# Patient Record
Sex: Female | Born: 1975 | Hispanic: No | Marital: Married | Smoking: Current every day smoker
Health system: Southern US, Community
[De-identification: ages and names within clinical notes are randomized; demographics above are authoritative.]

## PROBLEM LIST (undated history)

## (undated) DIAGNOSIS — O24419 Gestational diabetes mellitus in pregnancy, unspecified control: Secondary | ICD-10-CM

## (undated) DIAGNOSIS — I10 Essential (primary) hypertension: Secondary | ICD-10-CM

## (undated) DIAGNOSIS — D649 Anemia, unspecified: Secondary | ICD-10-CM

## (undated) DIAGNOSIS — E079 Disorder of thyroid, unspecified: Secondary | ICD-10-CM

## (undated) DIAGNOSIS — E119 Type 2 diabetes mellitus without complications: Secondary | ICD-10-CM

## (undated) DIAGNOSIS — F329 Major depressive disorder, single episode, unspecified: Secondary | ICD-10-CM

## (undated) DIAGNOSIS — F419 Anxiety disorder, unspecified: Secondary | ICD-10-CM

## (undated) DIAGNOSIS — F32A Depression, unspecified: Secondary | ICD-10-CM

## (undated) HISTORY — DX: Disorder of thyroid, unspecified: E07.9

## (undated) HISTORY — DX: Gestational diabetes mellitus in pregnancy, unspecified control: O24.419

## (undated) HISTORY — DX: Major depressive disorder, single episode, unspecified: F32.9

## (undated) HISTORY — DX: Anxiety disorder, unspecified: F41.9

## (undated) HISTORY — DX: Type 2 diabetes mellitus without complications: E11.9

## (undated) HISTORY — PX: TUBAL LIGATION: SHX77

## (undated) HISTORY — DX: Anemia, unspecified: D64.9

## (undated) HISTORY — DX: Depression, unspecified: F32.A

---

## 2002-09-08 HISTORY — PX: CHOLECYSTECTOMY: SHX55

## 2002-09-26 ENCOUNTER — Encounter: Payer: Self-pay | Admitting: Family Medicine

## 2002-09-26 ENCOUNTER — Ambulatory Visit (HOSPITAL_COMMUNITY): Admission: RE | Admit: 2002-09-26 | Discharge: 2002-09-26 | Payer: Self-pay | Admitting: Family Medicine

## 2002-09-27 ENCOUNTER — Other Ambulatory Visit: Admission: RE | Admit: 2002-09-27 | Discharge: 2002-09-27 | Payer: Self-pay | Admitting: Family Medicine

## 2002-11-08 ENCOUNTER — Ambulatory Visit (HOSPITAL_COMMUNITY): Admission: RE | Admit: 2002-11-08 | Discharge: 2002-11-08 | Payer: Self-pay | Admitting: Family Medicine

## 2002-11-08 ENCOUNTER — Encounter: Payer: Self-pay | Admitting: Family Medicine

## 2003-02-27 ENCOUNTER — Encounter: Payer: Self-pay | Admitting: Family Medicine

## 2003-02-27 ENCOUNTER — Ambulatory Visit (HOSPITAL_COMMUNITY): Admission: RE | Admit: 2003-02-27 | Discharge: 2003-02-27 | Payer: Self-pay | Admitting: Family Medicine

## 2003-03-03 ENCOUNTER — Encounter: Admission: RE | Admit: 2003-03-03 | Discharge: 2003-03-03 | Payer: Self-pay | Admitting: Family Medicine

## 2003-03-03 ENCOUNTER — Inpatient Hospital Stay (HOSPITAL_COMMUNITY): Admission: AD | Admit: 2003-03-03 | Discharge: 2003-03-03 | Payer: Self-pay | Admitting: Family Medicine

## 2003-03-10 ENCOUNTER — Encounter: Admission: RE | Admit: 2003-03-10 | Discharge: 2003-03-10 | Payer: Self-pay | Admitting: *Deleted

## 2003-03-17 ENCOUNTER — Encounter: Admission: RE | Admit: 2003-03-17 | Discharge: 2003-03-17 | Payer: Self-pay | Admitting: Family Medicine

## 2003-03-18 ENCOUNTER — Inpatient Hospital Stay (HOSPITAL_COMMUNITY): Admission: AD | Admit: 2003-03-18 | Discharge: 2003-03-20 | Payer: Self-pay | Admitting: Family Medicine

## 2003-03-21 ENCOUNTER — Encounter: Admission: RE | Admit: 2003-03-21 | Discharge: 2003-04-20 | Payer: Self-pay | Admitting: Family Medicine

## 2003-05-19 ENCOUNTER — Encounter: Payer: Self-pay | Admitting: *Deleted

## 2003-05-19 ENCOUNTER — Inpatient Hospital Stay (HOSPITAL_COMMUNITY): Admission: EM | Admit: 2003-05-19 | Discharge: 2003-05-23 | Payer: Self-pay | Admitting: *Deleted

## 2003-05-20 ENCOUNTER — Encounter: Payer: Self-pay | Admitting: General Surgery

## 2003-05-22 ENCOUNTER — Encounter: Payer: Self-pay | Admitting: Internal Medicine

## 2005-12-25 ENCOUNTER — Ambulatory Visit (HOSPITAL_COMMUNITY): Admission: RE | Admit: 2005-12-25 | Discharge: 2005-12-25 | Payer: Self-pay | Admitting: Family Medicine

## 2010-10-08 ENCOUNTER — Emergency Department (HOSPITAL_COMMUNITY)
Admission: EM | Admit: 2010-10-08 | Discharge: 2010-10-08 | Payer: Self-pay | Source: Home / Self Care | Admitting: Emergency Medicine

## 2016-04-02 ENCOUNTER — Encounter: Payer: Self-pay | Admitting: Neurology

## 2016-05-07 ENCOUNTER — Ambulatory Visit (INDEPENDENT_AMBULATORY_CARE_PROVIDER_SITE_OTHER): Payer: BLUE CROSS/BLUE SHIELD | Admitting: Family Medicine

## 2016-05-07 ENCOUNTER — Encounter: Payer: Self-pay | Admitting: Family Medicine

## 2016-05-07 VITALS — BP 130/82 | HR 100 | Resp 18 | Ht 59.5 in | Wt 176.0 lb

## 2016-05-07 DIAGNOSIS — F4001 Agoraphobia with panic disorder: Secondary | ICD-10-CM | POA: Insufficient documentation

## 2016-05-07 DIAGNOSIS — O24419 Gestational diabetes mellitus in pregnancy, unspecified control: Secondary | ICD-10-CM

## 2016-05-07 DIAGNOSIS — Z7189 Other specified counseling: Secondary | ICD-10-CM

## 2016-05-07 DIAGNOSIS — E039 Hypothyroidism, unspecified: Secondary | ICD-10-CM | POA: Diagnosis not present

## 2016-05-07 DIAGNOSIS — E049 Nontoxic goiter, unspecified: Secondary | ICD-10-CM

## 2016-05-07 DIAGNOSIS — Z720 Tobacco use: Secondary | ICD-10-CM

## 2016-05-07 DIAGNOSIS — Z7689 Persons encountering health services in other specified circumstances: Secondary | ICD-10-CM

## 2016-05-07 DIAGNOSIS — O24410 Gestational diabetes mellitus in pregnancy, diet controlled: Secondary | ICD-10-CM | POA: Diagnosis not present

## 2016-05-07 DIAGNOSIS — E010 Iodine-deficiency related diffuse (endemic) goiter: Secondary | ICD-10-CM | POA: Insufficient documentation

## 2016-05-07 HISTORY — DX: Agoraphobia with panic disorder: F40.01

## 2016-05-07 HISTORY — DX: Gestational diabetes mellitus in pregnancy, unspecified control: O24.419

## 2016-05-07 MED ORDER — FLUOXETINE HCL 20 MG PO TABS: 20.0000 mg | ORAL_TABLET | Freq: Every day | ORAL | 3 refills | Status: DC

## 2016-05-07 NOTE — Progress Notes (Signed)
Chief Complaint  Patient presents with  . Establish Care    no previous pcp    40 year old Hispanic woman.  Born in [place], came to [place] a small child, "before kindergarten" here to establish care. Has not been to a doctor for about 9 years.   States was told she had problems with gestational diabetes and thyroid with her pregnancy, but never followed up.  Now worries about weight gain and hair thinning and thinks her thyroid is the problem.  She has longstanding anxiety that is untreated.  She says she cannot leave the house for social events, gets upset easily, cannot attend functions at the childrens school.  Wants treatment.  Discussed SSRI use and this is recommended.  Will prescribe fluoxetine because it has the least effect on weight.  Last Pap about 349 y.  Always nor,al.  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  Patient Active Problem List   Diagnosis Date Noted  . Gestational diabetes 05/07/2016  . Hypothyroidism 05/07/2016  . 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 Thyromegaly 05/07/2016    Outpatient Encounter Prescriptions as of 05/07/2016  Medication Sig  . FLUoxetine (PROZAC) 20 MG tablet Take 1 tablet (20 mg total) by mouth daily.   No facility-administered encounter medications on file as of 05/07/2016.    Family History  Problem Relation Age of Onset  . Hyperlipidemia Mother   . Hypertension Mother   . Diabetes Mother   . Heart disease Father [id number]    MI  . Hyperthyroidism Sister   . Diabetes Maternal Grandmother    Past Surgical History:  Procedure Laterality Date  . CHOLECYSTECTOMY  2004  . TUBAL LIGATION     Social History   Social History  . Marital status: Single    Spouse name: N/A  . Number of children: N/A  . Years of education: N/A   Occupational History  . Not on file.   Social History Main Topics  . Smoking status: Current Every Day Smoker    Packs/day: 0.10    Types: Cigarettes  . Smokeless tobacco:  Never Used     Comment: 2 cigarettes daily   . Alcohol use No  . Drug use: No  . Sexual activity: Yes    Birth control/ protection: Surgical   Other Topics Concern  . Not on file   Social History Narrative  . No narrative on file   Review of Systems  Constitutional: Negative for chills, fever and weight loss.       Gain weight - cannot lose  HENT: Negative for congestion and hearing loss.   Eyes: Negative for blurred vision and pain.  Respiratory: Negative for cough and shortness of breath.   Cardiovascular: Negative for chest pain, palpitations and leg swelling.  Gastrointestinal: Negative for abdominal pain, constipation, diarrhea and heartburn.  Genitourinary: Negative for dysuria and frequency.  Musculoskeletal: Negative for falls, joint pain and myalgias.  Neurological: Negative for dizziness, seizures and headaches.  Endo/Heme/Allergies: Negative for environmental allergies. Does not bruise/bleed easily.  Psychiatric/Behavioral: Negative for depression. The patient is nervous/anxious. The patient does not have insomnia.        Panic, anxiety leaving house   Physical Exam  Constitutional: She is oriented to person, place, and time. She appears well-developed and well-nourished. No distress.  HENT:  Head: Normocephalic and atraumatic.  Right Ear: External ear normal.  Left Ear: External ear normal.  Mouth/Throat: Oropharynx is clear and moist.  Eyes: Conjunctivae  are normal. Pupils are equal, round, and reactive to light.  Neck: Normal range of motion. Neck supple. Thyromegaly present.  Cardiovascular: Normal rate, regular rhythm and normal heart sounds.   Pulmonary/Chest: Effort normal and breath sounds normal.  Abdominal: Soft. Bowel sounds are normal. There is no tenderness.  Musculoskeletal: Normal range of motion. She exhibits no edema.  Lymphadenopathy:    She has no cervical adenopathy.  Neurological: She is alert and oriented to person, place, and time.  Dull  reflexes  Skin: Skin is warm and dry.  Psychiatric: She has a normal mood and affect. Her behavior is normal. Judgment and thought content normal.   1. Encounter to establish care with new doctor Lack of medical care many years  2. Diet-controlled gestational diabetes mellitus, unspecified trimester  - COMPLETE METABOLIC PANEL WITH GFR - HgB Z6X - CBC  3. Hypothyroidism, unspecified hypothyroidism type  - TSH  4. Agoraphobia with panic disorder Start fluoxetine.  Risks and benefits discussed.  5. Thyromegaly   6. Tobacco abuse Advised to quit- smokes 2 cig a day.  Patient Instructions  Take the fluoxetine every day This is for anxiety Call if you have any proplems or side effects See me in a month  Due for a PAP and PE  Labs ordered I will notify you of your results

## 2016-05-07 NOTE — Patient Instructions (Signed)
Take the fluoxetine every day This is for anxiety Call if you have any proplems or side effects See me in a month  Due for a PAP and PE  Labs ordered I will notify you of your results

## 2016-05-08 LAB — COMPLETE METABOLIC PANEL WITH GFR
ALBUMIN: 3.8 g/dL (ref 3.6–5.1)
ALK PHOS: 93 U/L (ref 33–115)
ALT: 21 U/L (ref 6–29)
AST: 37 U/L — AB (ref 10–30)
BUN: 10 mg/dL (ref 7–25)
CALCIUM: 8.8 mg/dL (ref 8.6–10.2)
CHLORIDE: 103 mmol/L (ref 98–110)
CO2: 26 mmol/L (ref 20–31)
Creat: 0.53 mg/dL (ref 0.50–1.10)
GFR, Est African American: >89 mL/min (ref >=60)
GFR, Est Non African American: >89 mL/min (ref >=60)
GLUCOSE: 90 mg/dL (ref 65–99)
Potassium: 4 mmol/L (ref 3.5–5.3)
Sodium: 139 mmol/L (ref 135–146)
TOTAL PROTEIN: 7.3 g/dL (ref 6.1–8.1)
Total Bilirubin: 0.6 mg/dL (ref 0.2–1.2)

## 2016-05-08 LAB — CBC
HCT: 27.5 % — ABNORMAL LOW (ref 35.0–45.0)
HEMOGLOBIN: 8.1 g/dL — AB (ref 11.7–15.5)
MCH: 18.9 pg — ABNORMAL LOW (ref 27.0–33.0)
MCHC: 29.5 g/dL — AB (ref 32.0–36.0)
MCV: 64.1 fL — ABNORMAL LOW (ref 80.0–100.0)
MPV: 9 fL (ref 7.5–12.5)
PLATELETS: 333 10*3/uL (ref 140–400)
RBC: 4.29 MIL/uL (ref 3.80–5.10)
RDW: 19.4 % — ABNORMAL HIGH (ref 11.0–15.0)
WBC: 7.8 10*3/uL (ref 3.8–10.8)

## 2016-05-08 LAB — TSH: TSH: 8.89 mIU/L — ABNORMAL HIGH

## 2016-05-09 ENCOUNTER — Encounter: Payer: Self-pay | Admitting: Family Medicine

## 2016-05-09 LAB — HEMOGLOBIN A1C
HEMOGLOBIN A1C: 5.2 % (ref <5.7)
MEAN PLASMA GLUCOSE: 103 mg/dL

## 2016-05-20 ENCOUNTER — Telehealth: Payer: Self-pay | Admitting: Family Medicine

## 2016-05-21 NOTE — Telephone Encounter (Signed)
Opened In Error

## 2016-05-22 ENCOUNTER — Ambulatory Visit (INDEPENDENT_AMBULATORY_CARE_PROVIDER_SITE_OTHER): Payer: BLUE CROSS/BLUE SHIELD | Admitting: Family Medicine

## 2016-05-22 ENCOUNTER — Encounter: Payer: Self-pay | Admitting: Family Medicine

## 2016-05-22 VITALS — BP 126/80 | HR 76 | Resp 18 | Ht 59.5 in | Wt 173.0 lb

## 2016-05-22 DIAGNOSIS — E039 Hypothyroidism, unspecified: Secondary | ICD-10-CM | POA: Diagnosis not present

## 2016-05-22 DIAGNOSIS — D509 Iron deficiency anemia, unspecified: Secondary | ICD-10-CM | POA: Diagnosis not present

## 2016-05-22 DIAGNOSIS — F4001 Agoraphobia with panic disorder: Secondary | ICD-10-CM

## 2016-05-22 DIAGNOSIS — D649 Anemia, unspecified: Secondary | ICD-10-CM | POA: Insufficient documentation

## 2016-05-22 LAB — CBC WITH DIFFERENTIAL/PLATELET
Basophils Absolute: 0 cells/uL (ref 0–200)
Basophils Relative: 0 %
EOS ABS: 144 {cells}/uL (ref 15–500)
Eosinophils Relative: 2 %
HEMATOCRIT: 27.3 % — AB (ref 35.0–45.0)
HEMOGLOBIN: 8 g/dL — AB (ref 11.7–15.5)
LYMPHS ABS: 2016 {cells}/uL (ref 850–3900)
LYMPHS PCT: 28 %
MCH: 18.8 pg — ABNORMAL LOW (ref 27.0–33.0)
MCHC: 29.3 g/dL — AB (ref 32.0–36.0)
MCV: 64.2 fL — AB (ref 80.0–100.0)
MONO ABS: 432 {cells}/uL (ref 200–950)
MPV: 8.6 fL (ref 7.5–12.5)
Monocytes Relative: 6 %
NEUTROS PCT: 64 %
Neutro Abs: 4608 cells/uL (ref 1500–7800)
Platelets: 410 10*3/uL — ABNORMAL HIGH (ref 140–400)
RBC: 4.25 MIL/uL (ref 3.80–5.10)
RDW: 19.3 % — AB (ref 11.0–15.0)
WBC: 7.2 10*3/uL (ref 3.8–10.8)

## 2016-05-22 LAB — ANEMIA PROFILE B
%SAT: 3 % — AB (ref 11–50)
FOLATE: 23.5 ng/mL (ref >5.4)
Ferritin: 5 ng/mL — ABNORMAL LOW (ref 10–154)
Iron: 12 ug/dL — ABNORMAL LOW (ref 40–190)
TIBC: 416 ug/dL (ref 250–450)
VITAMIN B 12: 402 pg/mL (ref 200–1100)

## 2016-05-22 LAB — RETICULOCYTES
ABS RETIC: 114750 {cells}/uL — AB (ref 20000–80000)
RBC.: 4.25 MIL/uL (ref 3.80–5.10)
Retic Ct Pct: 2.7 %

## 2016-05-22 MED ORDER — LEVOTHYROXINE SODIUM 75 MCG PO TABS: 75.0000 ug | ORAL_TABLET | Freq: Every day | ORAL | 3 refills | Status: DC

## 2016-05-22 NOTE — Addendum Note (Signed)
Addended by: Kandis FantasiaSLADE, COURTNEY B on: 05/22/2016 10:24 AM   Modules accepted: Orders

## 2016-05-22 NOTE — Progress Notes (Signed)
Chief Complaint  Patient presents with  . Results    discuss lab results  . Medication Management    would like to discuss fluoxetine    Here for follow up Started on fluoxetine last visit.  Cannot tell it is helping yet.  No side effects.  We discussed that this medicine takes 4-6 weeks to take full effect and that it would be best for her to give it some time. If it is not started to help her by the next visit, then changing to a different SSRI may be advisable. Labwork is discussed with patient. She is hypothyroid. Her TSH is only 8.8, but given her fatigue and symptoms of its best to treat this. I do worry that thyroid hormone may increase her anxiety and feeling of tremulousness. I am going to start her on a low dose and follow. She was found on blood work to be significantly anemic. She does recall being anemic before. She does have heavy menstrual periods she does not take any vitamin or iron supplements. She is on a well-balanced diet. I told her this may contribute to her feeling of fatigue. Additional lab work is indicated to determine/confirm that it is an iron deficiency anemia. She denies any unusual blood loss, blood in her bowels, or gastrointestinal symptoms. We discussed that she does have a treadmill in her home. I recommend that she start walking on this daily. She should start slow and then workup. Her goal is 150 minutes a week. She still smokes cigarettes. She only smokes 2 cigarettes a day and doesn't see that this is a problem for her. I encouraged her to try to quit.   Patient Active Problem List   Diagnosis Date Noted  . Anemia 05/22/2016  . Hypothyroidism 05/07/2016  . 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 Thyromegaly 05/07/2016    Outpatient Encounter Prescriptions as of 05/22/2016  Medication Sig  . FLUoxetine (PROZAC) 20 MG tablet Take 1 tablet (20 mg total) by mouth daily.  Marland Kitchen levothyroxine (SYNTHROID, LEVOTHROID) 75 MCG tablet Take 1 tablet (75  mcg total) by mouth daily.   No facility-administered encounter medications on file as of 05/22/2016.     No Known Allergies  Review of Systems  Constitutional: Positive for fatigue. Negative for activity change and appetite change.  HENT: Negative.  Negative for congestion and dental problem.   Eyes: Negative.  Negative for visual disturbance.  Respiratory: Negative.  Negative for cough and shortness of breath.   Cardiovascular: Negative.  Negative for chest pain, palpitations and leg swelling.  Gastrointestinal: Negative.  Negative for blood in stool, constipation and diarrhea.  Genitourinary: Negative for difficulty urinating, hematuria and menstrual problem.  Neurological: Positive for tremors.       Patient is worried about developing a tremor like her mother. She does have tremulous hands from her anxiety.  Psychiatric/Behavioral: Negative for dysphoric mood and sleep disturbance. The patient is nervous/anxious.    Results for ZEMIRA, ZEHRING (MRN [id number]) as of 05/22/2016 10:14  Ref. Range 05/07/2016 08:22  Sodium Latest Ref Range: 135 - 146 mmol/L 139  Potassium Latest Ref Range: 3.5 - 5.3 mmol/L 4.0  Chloride Latest Ref Range: 98 - 110 mmol/L 103  CO2 Latest Ref Range: 20 - 31 mmol/L 26  Mean Plasma Glucose Latest Units: mg/dL 413  BUN Latest Ref Range: 7 - 25 mg/dL 10  Creatinine Latest Ref Range: 0.50 - 1.10 mg/dL 2.44  Calcium Latest Ref Range: 8.6 - 10.2 mg/dL 8.8  Glucose Latest Ref Range: 65 - 99 mg/dL 90  Alkaline Phosphatase Latest Ref Range: 33 - 115 U/L 93  Albumin Latest Ref Range: 3.6 - 5.1 g/dL 3.8  AST Latest Ref Range: 10 - 30 U/L 37 (H)  ALT Latest Ref Range: 6 - 29 U/L 21  Total Protein Latest Ref Range: 6.1 - 8.1 g/dL 7.3  Total Bilirubin Latest Ref Range: 0.2 - 1.2 mg/dL 0.6  GFR, Est African American Latest Ref Range: >=60 mL/min >89  GFR, Est Non African American Latest Ref Range: >=60 mL/min >89  WBC Latest Ref Range: 3.8 - 10.8 K/uL 7.8  RBC Latest  Ref Range: 3.80 - 5.10 MIL/uL 4.29  Hemoglobin Latest Ref Range: 11.7 - 15.5 g/dL 8.1 (L)  HCT Latest Ref Range: 35.0 - 45.0 % 27.5 (L)  MCV Latest Ref Range: 80.0 - 100.0 fL 64.1 (L)  MCH Latest Ref Range: 27.0 - 33.0 pg 18.9 (L)  MCHC Latest Ref Range: 32.0 - 36.0 g/dL 40.929.5 (L)  RDW Latest Ref Range: 11.0 - 15.0 % 19.4 (H)  Platelets Latest Ref Range: 140 - 400 K/uL 333  MPV Latest Ref Range: 7.5 - 12.5 fL 9.0  Hemoglobin A1C Latest Ref Range: <5.7 % 5.2  TSH Latest Units: mIU/L 8.89 (H)    BP 126/80   Pulse 76   Resp 18   Ht 4' 11.5" (1.511 m)   Wt 173 lb (78.5 kg)   LMP 04/23/2016 (Within Days)   SpO2 98%   BMI 34.36 kg/m   Physical Exam  Constitutional: She is oriented to person, place, and time. She appears well-developed and well-nourished. No distress.  HENT:  Head: Normocephalic and atraumatic.  Mouth/Throat: Oropharynx is clear and moist.  Eyes: Conjunctivae are normal. Pupils are equal, round, and reactive to light.  Neck: Normal range of motion. Neck supple. Thyromegaly present.  Cardiovascular: Normal rate, regular rhythm and normal heart sounds.   Pulmonary/Chest: Effort normal and breath sounds normal.  Abdominal: Soft. Bowel sounds are normal. There is no tenderness.  Musculoskeletal: Normal range of motion. She exhibits no edema.  Lymphadenopathy:    She has no cervical adenopathy.  Neurological: She is alert and oriented to person, place, and time.  Dull reflexes  Skin: Skin is warm and dry.  Psychiatric: She has a normal mood and affect. Her behavior is normal. Judgment and thought content normal.   ASSESSMENT/PLAN:  1. Iron deficiency anemia  - CBC with Differential - IBC panel - Retic  2. Hypothyroidism, unspecified hypothyroidism type   3. Agoraphobia with panic disorder    Patient Instructions  The fluoxetine takes a while to work, give it 4-6 weeks to take full effect If it does not seem to help, I will change medicine  Take the  synthroid once a day on an empty stomach  Need more blood work to check on the anemia  Start an iron supplement  See me in one month for a PE and flu shot   Eustace MooreYvonne Sue Donn Wilmot, MD

## 2016-05-22 NOTE — Patient Instructions (Signed)
The fluoxetine takes a while to work, give it 4-6 weeks to take full effect If it does not seem to help, I will change medicine  Take the synthroid once a day on an empty stomach  Need more blood work to check on the anemia  Start an iron supplement  See me in one month for a PE and flu shot

## 2016-05-27 ENCOUNTER — Encounter: Payer: Self-pay | Admitting: Family Medicine

## 2016-06-09 ENCOUNTER — Encounter: Payer: BLUE CROSS/BLUE SHIELD | Admitting: Family Medicine

## 2016-06-23 ENCOUNTER — Encounter: Payer: BLUE CROSS/BLUE SHIELD | Admitting: Family Medicine

## 2016-07-01 ENCOUNTER — Encounter: Payer: BLUE CROSS/BLUE SHIELD | Admitting: Family Medicine

## 2017-11-16 ENCOUNTER — Encounter: Payer: Self-pay | Admitting: Family Medicine

## 2018-02-05 DIAGNOSIS — Z6837 Body mass index (BMI) 37.0-37.9, adult: Secondary | ICD-10-CM | POA: Diagnosis not present

## 2018-02-05 DIAGNOSIS — E038 Other specified hypothyroidism: Secondary | ICD-10-CM | POA: Diagnosis not present

## 2018-02-05 DIAGNOSIS — I10 Essential (primary) hypertension: Secondary | ICD-10-CM | POA: Diagnosis not present

## 2018-02-12 DIAGNOSIS — I10 Essential (primary) hypertension: Secondary | ICD-10-CM | POA: Diagnosis not present

## 2018-02-12 DIAGNOSIS — E038 Other specified hypothyroidism: Secondary | ICD-10-CM | POA: Diagnosis not present

## 2018-02-12 DIAGNOSIS — Z6837 Body mass index (BMI) 37.0-37.9, adult: Secondary | ICD-10-CM | POA: Diagnosis not present

## 2018-05-06 DIAGNOSIS — Z6836 Body mass index (BMI) 36.0-36.9, adult: Secondary | ICD-10-CM | POA: Diagnosis not present

## 2018-05-06 DIAGNOSIS — I10 Essential (primary) hypertension: Secondary | ICD-10-CM | POA: Diagnosis not present

## 2018-05-06 DIAGNOSIS — Z6837 Body mass index (BMI) 37.0-37.9, adult: Secondary | ICD-10-CM | POA: Diagnosis not present

## 2018-05-06 DIAGNOSIS — E038 Other specified hypothyroidism: Secondary | ICD-10-CM | POA: Diagnosis not present

## 2018-05-06 DIAGNOSIS — Z0000 Encounter for general adult medical examination without abnormal findings: Secondary | ICD-10-CM | POA: Diagnosis not present

## 2018-08-04 DIAGNOSIS — Z6837 Body mass index (BMI) 37.0-37.9, adult: Secondary | ICD-10-CM | POA: Diagnosis not present

## 2018-08-04 DIAGNOSIS — E038 Other specified hypothyroidism: Secondary | ICD-10-CM | POA: Diagnosis not present

## 2018-08-04 DIAGNOSIS — I10 Essential (primary) hypertension: Secondary | ICD-10-CM | POA: Diagnosis not present

## 2018-11-04 DIAGNOSIS — I10 Essential (primary) hypertension: Secondary | ICD-10-CM | POA: Diagnosis not present

## 2018-11-04 DIAGNOSIS — Z6837 Body mass index (BMI) 37.0-37.9, adult: Secondary | ICD-10-CM | POA: Diagnosis not present

## 2018-11-04 DIAGNOSIS — F064 Anxiety disorder due to known physiological condition: Secondary | ICD-10-CM | POA: Diagnosis not present

## 2018-11-04 DIAGNOSIS — E038 Other specified hypothyroidism: Secondary | ICD-10-CM | POA: Diagnosis not present

## 2018-11-05 DIAGNOSIS — I10 Essential (primary) hypertension: Secondary | ICD-10-CM | POA: Diagnosis not present

## 2018-11-05 DIAGNOSIS — E038 Other specified hypothyroidism: Secondary | ICD-10-CM | POA: Diagnosis not present

## 2018-11-05 DIAGNOSIS — Z6837 Body mass index (BMI) 37.0-37.9, adult: Secondary | ICD-10-CM | POA: Diagnosis not present

## 2018-11-05 DIAGNOSIS — F064 Anxiety disorder due to known physiological condition: Secondary | ICD-10-CM | POA: Diagnosis not present

## 2018-11-11 DIAGNOSIS — R079 Chest pain, unspecified: Secondary | ICD-10-CM | POA: Diagnosis not present

## 2019-02-03 DIAGNOSIS — M722 Plantar fascial fibromatosis: Secondary | ICD-10-CM | POA: Diagnosis not present

## 2019-02-03 DIAGNOSIS — I10 Essential (primary) hypertension: Secondary | ICD-10-CM | POA: Diagnosis not present

## 2019-02-03 DIAGNOSIS — E038 Other specified hypothyroidism: Secondary | ICD-10-CM | POA: Diagnosis not present

## 2019-02-03 DIAGNOSIS — F064 Anxiety disorder due to known physiological condition: Secondary | ICD-10-CM | POA: Diagnosis not present

## 2019-05-05 DIAGNOSIS — F064 Anxiety disorder due to known physiological condition: Secondary | ICD-10-CM | POA: Diagnosis not present

## 2019-05-05 DIAGNOSIS — E038 Other specified hypothyroidism: Secondary | ICD-10-CM | POA: Diagnosis not present

## 2019-05-05 DIAGNOSIS — I10 Essential (primary) hypertension: Secondary | ICD-10-CM | POA: Diagnosis not present

## 2020-10-21 ENCOUNTER — Emergency Department (HOSPITAL_COMMUNITY)
Admission: EM | Admit: 2020-10-21 | Discharge: 2020-10-21 | Disposition: A | Discharge status: Home / Self Care | Payer: 59 | Attending: Emergency Medicine | Admitting: Emergency Medicine

## 2020-10-21 ENCOUNTER — Emergency Department (HOSPITAL_COMMUNITY): Payer: 59

## 2020-10-21 ENCOUNTER — Other Ambulatory Visit: Payer: Self-pay

## 2020-10-21 ENCOUNTER — Encounter (HOSPITAL_COMMUNITY): Payer: Self-pay | Admitting: Emergency Medicine

## 2020-10-21 DIAGNOSIS — E119 Type 2 diabetes mellitus without complications: Secondary | ICD-10-CM | POA: Insufficient documentation

## 2020-10-21 DIAGNOSIS — Z79899 Other long term (current) drug therapy: Secondary | ICD-10-CM | POA: Diagnosis not present

## 2020-10-21 DIAGNOSIS — E039 Hypothyroidism, unspecified: Secondary | ICD-10-CM | POA: Insufficient documentation

## 2020-10-21 DIAGNOSIS — R519 Headache, unspecified: Secondary | ICD-10-CM | POA: Insufficient documentation

## 2020-10-21 DIAGNOSIS — R079 Chest pain, unspecified: Secondary | ICD-10-CM | POA: Insufficient documentation

## 2020-10-21 DIAGNOSIS — R110 Nausea: Secondary | ICD-10-CM | POA: Diagnosis not present

## 2020-10-21 DIAGNOSIS — I10 Essential (primary) hypertension: Secondary | ICD-10-CM | POA: Insufficient documentation

## 2020-10-21 DIAGNOSIS — F17210 Nicotine dependence, cigarettes, uncomplicated: Secondary | ICD-10-CM | POA: Diagnosis not present

## 2020-10-21 HISTORY — DX: Essential (primary) hypertension: I10

## 2020-10-21 LAB — CBC
HCT: 36.5 % (ref 36.0–46.0)
Hemoglobin: 12 g/dL (ref 12.0–15.0)
MCH: 28.6 pg (ref 26.0–34.0)
MCHC: 32.9 g/dL (ref 30.0–36.0)
MCV: 87.1 fL (ref 80.0–100.0)
Platelets: 281 10*3/uL (ref 150–400)
RBC: 4.19 MIL/uL (ref 3.87–5.11)
RDW: 13.9 % (ref 11.5–15.5)
WBC: 10.9 10*3/uL — ABNORMAL HIGH (ref 4.0–10.5)
nRBC: 0 % (ref 0.0–0.2)

## 2020-10-21 LAB — BASIC METABOLIC PANEL
Anion gap: 7 (ref 5–15)
BUN: 15 mg/dL (ref 6–20)
CO2: 24 mmol/L (ref 22–32)
Calcium: 9 mg/dL (ref 8.9–10.3)
Chloride: 105 mmol/L (ref 98–111)
Creatinine, Ser: 0.54 mg/dL (ref 0.44–1.00)
GFR, Estimated: >60 mL/min (ref >60)
Glucose, Bld: 155 mg/dL — ABNORMAL HIGH (ref 70–99)
Potassium: 3.4 mmol/L — ABNORMAL LOW (ref 3.5–5.1)
Sodium: 136 mmol/L (ref 135–145)

## 2020-10-21 LAB — SEDIMENTATION RATE: Sed Rate: 40 mm/hr — ABNORMAL HIGH (ref 0–22)

## 2020-10-21 MED ORDER — KETOROLAC TROMETHAMINE 30 MG/ML IJ SOLN
15.0000 mg | Freq: Once | INTRAMUSCULAR | Status: AC
  Administered 2020-10-21: 15 mg via INTRAVENOUS
  Filled 2020-10-21: qty 1

## 2020-10-21 MED ORDER — PROMETHAZINE HCL 25 MG PO TABS: 12.5000 mg | ORAL_TABLET | Freq: Three times a day (TID) | ORAL | 0 refills | Status: DC | PRN

## 2020-10-21 MED ORDER — PROMETHAZINE HCL 25 MG/ML IJ SOLN
12.5000 mg | Freq: Once | INTRAMUSCULAR | Status: AC
  Administered 2020-10-21: 12.5 mg via INTRAVENOUS
  Filled 2020-10-21: qty 1

## 2020-10-21 MED ORDER — SODIUM CHLORIDE 0.9 % IV BOLUS
500.0000 mL | Freq: Once | INTRAVENOUS | Status: AC
  Administered 2020-10-21: 500 mL via INTRAVENOUS

## 2020-10-21 NOTE — ED Triage Notes (Signed)
Pt to the ED with c/o headaches x 1 month. Pt has been seen by her PCP who has been treating her for migraines.  She states she does not believe she has migraines as the medication that was prescribed is not working.

## 2020-10-21 NOTE — ED Provider Notes (Signed)
[facility] EMERGENCY DEPARTMENT Provider Note   CSN: [id number] Arrival date & time: 10/21/20  2049     History Chief Complaint  Patient presents with  . Headache    Meagan Reynolds is a 45 y.o. female.  HPI Patient presents with headache. Throbbing right-sided. Has had for about a month. Is been up pretty constantly although wax and wane somewhat. Saw her PCP and started on medicine for migraines. States it did not work. Has had some mild nausea. No photophobia. No fevers or chills. No injury. States she cannot sleep on that side because of pain with palpation. At time also states she has chest pain. Sharp. Comes and goes. Does not feel short of breath. No swelling in legs. No abdominal pain. Denies possibility of pregnancy. No history of headaches like this before a month ago. States that she would have occasional headaches but not really this type and it would always go away with Tylenol.    Past Medical History:  Diagnosis Date  . Anemia   . Anxiety   . Depression   . Diabetes mellitus without complication (HCC)    gestational  . Gestational diabetes 05/07/2016  . Hypertension   . Thyroid disease     Patient Active Problem List   Diagnosis Date Noted  . Anemia 05/22/2016  . Hypothyroidism 05/07/2016  . 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 Thyromegaly 05/07/2016    Past Surgical History:  Procedure Laterality Date  . CHOLECYSTECTOMY  2004  . TUBAL LIGATION       OB History   No obstetric history on file.     Family History  Problem Relation Age of Onset  . Hyperlipidemia Mother   . Hypertension Mother   . Diabetes Mother   . Heart disease Father 4       MI  . Hyperthyroidism Sister   . Diabetes Maternal Grandmother     Social History   Tobacco Use  . Smoking status: Current Every Day Smoker    Packs/day: 0.10    Types: Cigarettes  . Smokeless tobacco: Never Used  . Tobacco comment: 2 cigarettes daily   Vaping Use  . Vaping Use: Never  used  Substance Use Topics  . Alcohol use: No  . Drug use: No    Home Medications Prior to Admission medications   Medication Sig Start Date End Date Taking? Authorizing Provider  promethazine (PHENERGAN) 25 MG tablet Take 0.5 tablets (12.5 mg total) by mouth every 8 (eight) hours as needed for nausea or vomiting. 10/21/20  Yes Benjiman Core, MD  FLUoxetine (PROZAC) 20 MG tablet Take 1 tablet (20 mg total) by mouth daily. 05/07/16   Eustace Moore, MD  levothyroxine (SYNTHROID, LEVOTHROID) 75 MCG tablet Take 1 tablet (75 mcg total) by mouth daily. 05/22/16   Eustace Moore, MD    Allergies    Patient has no known allergies.  Review of Systems   Review of Systems  Constitutional: Negative for appetite change and fever.  HENT: Negative for congestion.   Eyes: Negative for photophobia and visual disturbance.  Respiratory: Negative for shortness of breath.   Cardiovascular: Positive for chest pain.  Gastrointestinal: Positive for nausea. Negative for abdominal pain.  Genitourinary: Negative for flank pain.  Musculoskeletal: Negative for back pain.  Skin: Negative for rash.  Neurological: Positive for headaches. Negative for weakness.  Psychiatric/Behavioral: Negative for confusion.    Physical Exam Updated Vital Signs BP (!) 150/82   Pulse 88   Temp 98.3  F (36.8 C) (Oral)   Resp 18   Ht 4\' 11"  (1.499 m)   Wt 85.3 kg   LMP 09/24/2020 (Approximate)   SpO2 100%   BMI 37.97 kg/m   Physical Exam Vitals and nursing note reviewed.  Constitutional:      Appearance: She is well-developed.  HENT:     Head: Atraumatic.     Comments: Tenderness to right lateral head. No skin changes. Eyes:     Extraocular Movements: Extraocular movements intact.     Pupils: Pupils are equal, round, and reactive to light.  Cardiovascular:     Rate and Rhythm: Regular rhythm.  Chest:     Chest wall: No tenderness.  Abdominal:     Tenderness: There is no abdominal tenderness.   Musculoskeletal:     Cervical back: Neck supple.  Skin:    General: Skin is warm.  Neurological:     Mental Status: She is alert and oriented to person, place, and time. Mental status is at baseline.  Psychiatric:        Mood and Affect: Mood normal.     ED Results / Procedures / Treatments   Labs (all labs ordered are listed, but only abnormal results are displayed) Labs Reviewed  BASIC METABOLIC PANEL - Abnormal; Notable for the following components:      Result Value   Potassium 3.4 (*)    Glucose, Bld 155 (*)    All other components within normal limits  CBC - Abnormal; Notable for the following components:   WBC 10.9 (*)    All other components within normal limits  SEDIMENTATION RATE    EKG None  Radiology DG Chest 2 View  Result Date: 10/21/2020 CLINICAL DATA:  Chest pain. EXAM: CHEST - 2 VIEW COMPARISON:  None. FINDINGS: The cardiomediastinal contours are normal. The lungs are clear. Pulmonary vasculature is normal. No consolidation, pleural effusion, or pneumothorax. No acute osseous abnormalities are seen. IMPRESSION: Normal chest radiographs. Electronically Signed   By: 10/23/2020 M.D.   On: 10/21/2020 22:14   CT Head Wo Contrast  Result Date: 10/21/2020 CLINICAL DATA:  Right-sided headaches for 1 month. EXAM: CT HEAD WITHOUT CONTRAST TECHNIQUE: Contiguous axial images were obtained from the base of the skull through the vertex without intravenous contrast. COMPARISON:  None. FINDINGS: Brain: No evidence of acute infarction, hemorrhage, hydrocephalus, extra-axial collection or mass lesion/mass effect. Vascular: No hyperdense vessel or unexpected calcification. Skull: Normal. Negative for fracture or focal lesion. Sinuses/Orbits: Visualized globes and orbits are unremarkable. Visualized sinuses are clear. Other: None. IMPRESSION: Normal enhanced CT scan of the brain. Electronically Signed   By: 10/23/2020 M.D.   On: 10/21/2020 22:01     Procedures Procedures   Medications Ordered in ED Medications  sodium chloride 0.9 % bolus 500 mL (500 mLs Intravenous New Bag/Given 10/21/20 2128)  promethazine (PHENERGAN) injection 12.5 mg (12.5 mg Intravenous Given 10/21/20 2133)  ketorolac (TORADOL) 30 MG/ML injection 15 mg (15 mg Intravenous Given 10/21/20 2128)    ED Course  I have reviewed the triage vital signs and the nursing notes.  Pertinent labs & imaging results that were available during my care of the patient were reviewed by me and considered in my medical decision making (see chart for details).    MDM Rules/Calculators/A&P                         Patient with headache.  Has had over the last month.  Constant.  Dull.  Throbbing.  Nonfocal neuro exam.  Some mild nausea.  No photophobia.  No vision changes.  Has seen PCP and started on unknown migraine medicine reportedly did not help.  Head CT done and reassuring.  Chest x-ray done due to sharp chest pain at times.  Lab work reassuring although sed rate still pending.  She is rather young for giant cell arteritis.  Sed rate done and pending but do not think we need to wait the extra time in the ER for it.  Can followed up with neurology or PCP.  Feeling better after treatment.  Discharge home.  We will give some oral Phenergan to take in case it flares back up although it may not help Final Clinical Impression(s) / ED Diagnoses Final diagnoses:  Nonintractable headache, unspecified chronicity pattern, unspecified headache type    Rx / DC Orders ED Discharge Orders         Ordered    promethazine (PHENERGAN) 25 MG tablet  Every 8 hours PRN        10/21/20 2231           Benjiman Core, MD 10/21/20 2235

## 2020-10-21 NOTE — Discharge Instructions (Signed)
Follow-up with neurology.  You can check the results of the sed rate on MyChart.  If it is more than 50 discuss with your doctor or the neurologist.

## 2020-10-23 ENCOUNTER — Encounter: Payer: Self-pay | Admitting: *Deleted

## 2020-10-24 ENCOUNTER — Encounter: Payer: Self-pay | Admitting: *Deleted

## 2020-10-24 ENCOUNTER — Ambulatory Visit (INDEPENDENT_AMBULATORY_CARE_PROVIDER_SITE_OTHER): Payer: 59 | Admitting: Cardiology

## 2020-10-24 ENCOUNTER — Encounter: Payer: Self-pay | Admitting: Cardiology

## 2020-10-24 ENCOUNTER — Other Ambulatory Visit: Payer: Self-pay

## 2020-10-24 VITALS — BP 130/80 | HR 74 | Ht 59.0 in | Wt 189.6 lb

## 2020-10-24 DIAGNOSIS — R079 Chest pain, unspecified: Secondary | ICD-10-CM

## 2020-10-24 DIAGNOSIS — R0602 Shortness of breath: Secondary | ICD-10-CM

## 2020-10-24 NOTE — Progress Notes (Signed)
Clinical Summary Meagan Reynolds is a 45 y.o.female seen as new consult, referred by Meagan Reynolds for the following medical problems.  1. Chest pain - 1 month of chest pains - midchest. Pressure like feeling, can be 6/10  In severity. Can have jabbing like pain in that area.  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 Some associated DOE  CAD risk factors: HTN, tobacco 1 cig per day   2. Panic/anxiety - follwoed by pcp  Past Medical History:  Diagnosis Date  . Anemia   . Anxiety   . Depression   . Diabetes mellitus without complication (HCC)    gestational  . Gestational diabetes 05/07/2016  . Hypertension   . Thyroid disease      No Known Allergies   Current Outpatient Medications  Medication Sig Dispense Refill  . bisoprolol-hydrochlorothiazide (ZIAC) 5-6.25 MG tablet Take 1 tablet by mouth daily.    . clonazePAM (KLONOPIN) 0.5 MG tablet Take 0.5 mg by mouth 2 (two) times daily.    . ferrous sulfate 324 MG TBEC Take 324 mg by mouth in the morning and at bedtime.    Marland Kitchen FLUoxetine (PROZAC) 20 MG tablet Take 1 tablet (20 mg total) by mouth daily. 30 tablet 3  . levothyroxine (SYNTHROID) 150 MCG tablet Take 150 mcg by mouth daily before breakfast.    . Omega-3 Fatty Acids (FISH OIL) 1000 MG CAPS Take by mouth in the morning, at noon, and at bedtime.    . promethazine (PHENERGAN) 25 MG tablet Take 0.5 tablets (12.5 mg total) by mouth every 8 (eight) hours as needed for nausea or vomiting. 4 tablet 0  . rizatriptan (MAXALT) 10 MG tablet Take 10 mg by mouth as needed for migraine. May repeat in 2 hours if needed    . topiramate (TOPAMAX) 25 MG tablet Take 25 mg by mouth 2 (two) times daily.     No current facility-administered medications for this visit.     Past Surgical History:  Procedure Laterality Date  . CHOLECYSTECTOMY  2004  . TUBAL LIGATION       No Known  Allergies    Family History  Problem Relation Age of Onset  . Hyperlipidemia Mother   . Hypertension Mother   . Diabetes Mother   . Heart disease Father 87       MI  . Hyperthyroidism Sister   . Diabetes Maternal Grandmother      Social History Meagan Reynolds reports that she has been smoking cigarettes. She has been smoking about 0.10 packs per day. She has never used smokeless tobacco. Meagan Reynolds reports no history of alcohol use.   Review of Systems CONSTITUTIONAL: No weight loss, fever, chills, weakness or fatigue.  HEENT: Eyes: No visual loss, blurred vision, double vision or yellow sclerae.No hearing loss, sneezing, congestion, runny nose or sore throat.  SKIN: No rash or itching.  CARDIOVASCULAR: per hpi RESPIRATORY: No shortness of breath, cough or sputum.  GASTROINTESTINAL: No anorexia, nausea, vomiting or diarrhea. No abdominal pain or blood.  GENITOURINARY: No burning on urination, no polyuria NEUROLOGICAL: No headache, dizziness, syncope, paralysis, ataxia, numbness or tingling in the extremities. No change in bowel or bladder control.  MUSCULOSKELETAL: No muscle, back pain, joint pain or stiffness.  LYMPHATICS: No enlarged nodes. No history of splenectomy.  PSYCHIATRIC: No history of depression or anxiety.  ENDOCRINOLOGIC: No reports  of sweating, cold or heat intolerance. No polyuria or polydipsia.  Marland Kitchen   Physical Examination Today's Vitals   10/24/20 0902  BP: 130/80  Pulse: 74  SpO2: 99%  Weight: 189 lb 9.6 oz (86 kg)  Height: 4\' 11"  (1.499 m)   Body mass index is 38.29 kg/m.  Gen: resting comfortably, no acute distress HEENT: no scleral icterus, pupils equal round and reactive, no palptable cervical adenopathy,  CV: RRR, no m/r/g, no jvd Resp: Clear to auscultation bilaterally GI: abdomen is soft, non-tender, non-distended, normal bowel sounds, no hepatosplenomegaly MSK: extremities are warm, no edema.  Skin: warm, no rash Neuro:  no focal  deficits Psych: appropriate affect     Assessment and Plan  1. Chest pain/SOB - unclear etiology, will plan for an exercise nuclear stress test - EKG today shows NSR, no ischemic changes      , M.D

## 2020-10-24 NOTE — Patient Instructions (Signed)
Your physician recommends that you schedule a follow-up appointment in: PENDING WITH DR BRANCH  Your physician recommends that you continue on your current medications as directed. Please refer to the Current Medication list given to you today.  Your physician has requested that you have en exercise stress myoview. For further information please visit [email or web address]. Please follow instruction sheet, as given.  Thank you for choosing [facility]!!    

## 2020-11-09 ENCOUNTER — Other Ambulatory Visit (HOSPITAL_COMMUNITY): Admission: RE | Admit: 2020-11-09 | Payer: 59 | Source: Ambulatory Visit

## 2020-11-12 ENCOUNTER — Ambulatory Visit (HOSPITAL_COMMUNITY): Payer: 59

## 2020-11-12 ENCOUNTER — Encounter (HOSPITAL_COMMUNITY): Payer: 59

## 2020-11-16 ENCOUNTER — Other Ambulatory Visit (HOSPITAL_COMMUNITY)
Admission: RE | Admit: 2020-11-16 | Discharge: 2020-11-16 | Disposition: A | Discharge status: Home / Self Care | Payer: 59 | Source: Ambulatory Visit | Attending: Cardiology | Admitting: Cardiology

## 2020-11-16 ENCOUNTER — Other Ambulatory Visit: Payer: Self-pay

## 2020-11-16 DIAGNOSIS — Z01812 Encounter for preprocedural laboratory examination: Secondary | ICD-10-CM | POA: Insufficient documentation

## 2020-11-16 DIAGNOSIS — Z20822 Contact with and (suspected) exposure to covid-19: Secondary | ICD-10-CM | POA: Diagnosis not present

## 2020-11-17 LAB — SARS CORONAVIRUS 2 (TAT 6-24 HRS): SARS Coronavirus 2: NEGATIVE

## 2020-11-19 ENCOUNTER — Ambulatory Visit (HOSPITAL_COMMUNITY)
Admission: RE | Admit: 2020-11-19 | Discharge: 2020-11-19 | Disposition: A | Discharge status: Home / Self Care | Payer: 59 | Source: Ambulatory Visit | Attending: Cardiology | Admitting: Cardiology

## 2020-11-19 ENCOUNTER — Encounter (HOSPITAL_COMMUNITY)
Admission: RE | Admit: 2020-11-19 | Discharge: 2020-11-19 | Disposition: A | Discharge status: Home / Self Care | Payer: 59 | Source: Ambulatory Visit | Attending: Cardiology | Admitting: Cardiology

## 2020-11-19 DIAGNOSIS — R079 Chest pain, unspecified: Secondary | ICD-10-CM | POA: Diagnosis not present

## 2020-11-19 LAB — NM MYOCAR MULTI W/SPECT W/WALL MOTION / EF
Estimated workload: 10.4 METS
Exercise duration (min): 9 min
Exercise duration (sec): 33 s
MPHR: 178 {beats}/min
Peak HR: 160 {beats}/min
Percent HR: 89 %
RPE: 15
Rest HR: 72 {beats}/min

## 2020-11-19 MED ORDER — TECHNETIUM TC 99M TETROFOSMIN IV KIT
30.0000 | PACK | Freq: Once | INTRAVENOUS | Status: AC | PRN
  Administered 2020-11-19: 30 via INTRAVENOUS

## 2020-11-19 MED ORDER — REGADENOSON 0.4 MG/5ML IV SOLN
INTRAVENOUS | Status: AC
  Filled 2020-11-19: qty 5

## 2020-11-19 MED ORDER — TECHNETIUM TC 99M TETROFOSMIN IV KIT
10.0000 | PACK | Freq: Once | INTRAVENOUS | Status: AC | PRN
  Administered 2020-11-19: 11 via INTRAVENOUS

## 2020-11-19 MED ORDER — SODIUM CHLORIDE FLUSH 0.9 % IV SOLN
INTRAVENOUS | Status: AC
  Administered 2020-11-19: 10 mL via INTRAVENOUS
  Filled 2020-11-19: qty 10

## 2020-11-21 ENCOUNTER — Telehealth: Payer: Self-pay | Admitting: *Deleted

## 2020-11-21 NOTE — Telephone Encounter (Signed)
Pt voiced understanding - 6 month f/u scheduled  

## 2020-11-21 NOTE — Telephone Encounter (Signed)
-----   Message from Antoine Poche, MD sent at 11/20/2020  8:04 AM EDT ----- Normal stress test, no signs of blockages in the heart. Needs to f/u with pcp to consider noncardiac causes of her symptoms. Can f/u with [place] 6 months   Dominga Ferry MD

## 2020-11-23 ENCOUNTER — Other Ambulatory Visit: Payer: Self-pay

## 2020-11-23 ENCOUNTER — Other Ambulatory Visit: Payer: Self-pay | Admitting: Internal Medicine

## 2020-11-23 ENCOUNTER — Other Ambulatory Visit (HOSPITAL_COMMUNITY): Payer: Self-pay | Admitting: Internal Medicine

## 2020-11-23 ENCOUNTER — Other Ambulatory Visit (HOSPITAL_COMMUNITY)
Admission: RE | Admit: 2020-11-23 | Discharge: 2020-11-23 | Disposition: A | Discharge status: Home / Self Care | Payer: 59 | Source: Ambulatory Visit | Attending: Internal Medicine | Admitting: Internal Medicine

## 2020-11-23 DIAGNOSIS — E063 Autoimmune thyroiditis: Secondary | ICD-10-CM | POA: Insufficient documentation

## 2020-11-23 DIAGNOSIS — E060 Acute thyroiditis: Secondary | ICD-10-CM

## 2020-11-23 LAB — TSH: TSH: 12.219 u[IU]/mL — ABNORMAL HIGH (ref 0.350–4.500)

## 2020-11-24 LAB — THYROID PEROXIDASE ANTIBODY: Thyroperoxidase Ab SerPl-aCnc: >600 IU/mL — ABNORMAL HIGH (ref 0–34)

## 2020-12-06 ENCOUNTER — Ambulatory Visit (HOSPITAL_COMMUNITY): Payer: 59

## 2020-12-09 DIAGNOSIS — R519 Headache, unspecified: Secondary | ICD-10-CM | POA: Insufficient documentation

## 2020-12-11 ENCOUNTER — Ambulatory Visit (HOSPITAL_COMMUNITY)
Admission: RE | Admit: 2020-12-11 | Discharge: 2020-12-11 | Disposition: A | Discharge status: Home / Self Care | Payer: 59 | Source: Ambulatory Visit | Attending: Internal Medicine | Admitting: Internal Medicine

## 2020-12-11 DIAGNOSIS — E060 Acute thyroiditis: Secondary | ICD-10-CM | POA: Diagnosis present

## 2021-01-21 ENCOUNTER — Ambulatory Visit (INDEPENDENT_AMBULATORY_CARE_PROVIDER_SITE_OTHER): Payer: 59 | Admitting: Internal Medicine

## 2021-01-21 ENCOUNTER — Other Ambulatory Visit: Payer: Self-pay

## 2021-01-21 ENCOUNTER — Encounter (INDEPENDENT_AMBULATORY_CARE_PROVIDER_SITE_OTHER): Payer: Self-pay | Admitting: Internal Medicine

## 2021-01-21 VITALS — BP 136/80 | HR 81 | Temp 97.3°F | Resp 17 | Ht <= 58 in | Wt 187.0 lb

## 2021-01-21 DIAGNOSIS — E559 Vitamin D deficiency, unspecified: Secondary | ICD-10-CM

## 2021-01-21 DIAGNOSIS — E042 Nontoxic multinodular goiter: Secondary | ICD-10-CM | POA: Diagnosis not present

## 2021-01-21 DIAGNOSIS — R5383 Other fatigue: Secondary | ICD-10-CM

## 2021-01-21 DIAGNOSIS — E039 Hypothyroidism, unspecified: Secondary | ICD-10-CM

## 2021-01-21 DIAGNOSIS — R519 Headache, unspecified: Secondary | ICD-10-CM

## 2021-01-21 DIAGNOSIS — Z13220 Encounter for screening for lipoid disorders: Secondary | ICD-10-CM

## 2021-01-21 DIAGNOSIS — Z131 Encounter for screening for diabetes mellitus: Secondary | ICD-10-CM

## 2021-01-21 DIAGNOSIS — R5381 Other malaise: Secondary | ICD-10-CM | POA: Diagnosis not present

## 2021-01-21 MED ORDER — NP THYROID 60 MG PO TABS: 60.0000 mg | ORAL_TABLET | Freq: Two times a day (BID) | ORAL | 3 refills | Status: DC

## 2021-01-21 NOTE — Progress Notes (Signed)
Metrics: Intervention Frequency ACO  Documented Smoking Status Yearly  Screened one or more times in 24 months  Cessation Counseling or  Active cessation medication Past 24 months  Past 24 months   Guideline developer: UpToDate (See UpToDate for funding source) Date Released: 2014       Wellness Office Visit  Subjective:  Patient ID: Meagan Reynolds, female    DOB: August 15, 1976  Age: 45 y.o. MRN: [id number]  CC: This 45 year old Hispanic lady comes to our practice as a new patient to establish care. HPI  She has a history of hypothyroidism which appears to be related to Hashimoto's disease based on recent TPO antibody levels which were extremely high and elevated TSH. She has been taking levothyroxine but she still has symptoms of hypothyroidism, fatigue, hair loss, dry skin, tendency towards constipation, menorrhagia, poor focus and concentration, weight gain. She did have an ultrasound in April of this year of her thyroid which showed nodules and the recommendation was to repeat the ultrasound in about 1 year. She also complains of right temporal headache which is a daily event.  She apparently did see neurology, Dr. Gerilyn Pilgrim in [place] and from the description, it appears that the temporal artery biopsy has been recommended. She has underlying hypertension. Past Medical History:  Diagnosis Date  . Anemia   . Anxiety   . Depression   . Diabetes mellitus without complication (HCC)    gestational  . Gestational diabetes 05/07/2016  . Hypertension   . Thyroid disease    Past Surgical History:  Procedure Laterality Date  . CHOLECYSTECTOMY  2004  . TUBAL LIGATION       Family History  Problem Relation Age of Onset  . Hyperlipidemia Mother   . Hypertension Mother   . Diabetes Mother   . Heart disease Father 20       MI  . Hyperthyroidism Sister   . 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   Social History   Tobacco Use  . Smoking status: Former Smoker    Packs/day: 0.10    Types: Cigarettes    Quit date: 10/24/2020    Years since quitting: 0.2  . Smokeless tobacco: Never Used  . Tobacco comment: 2 cigarettes daily   Substance Use Topics  . Alcohol use: No    Current Meds  Medication Sig  . Ascorbic Acid (VITAMIN C) 1000 MG tablet Take 1,000 mg by mouth daily.  . bisoprolol-hydrochlorothiazide (ZIAC) 5-6.25 MG tablet Take 1 tablet by mouth daily.  . ferrous sulfate 324 MG TBEC Take 324 mg by mouth in the morning and at bedtime.  Marland Kitchen levothyroxine (SYNTHROID) 150 MCG tablet Take 150 mcg by mouth daily before breakfast.  . NP THYROID 60 MG tablet Take 1 tablet (60 mg total) by mouth in the morning and at bedtime.  . topiramate (TOPAMAX) 25 MG tablet Take 25 mg by mouth 2 (two) times daily.       Objective:   Today's Vitals: BP 136/80 (BP Location: Right Arm, Patient Position: Sitting, Cuff Size: Normal)   Pulse 81   Temp (!) 97.3 F (36.3 C) (Temporal)   Resp 17   Ht 4\' 10"  (1.473 m)   Wt 187 lb (84.8 kg)   SpO2 99%   BMI 39.08 kg/m  Vitals with BMI 01/21/2021 10/24/2020 10/21/2020  Height  4\' 10"  4\' 11"  -  Weight 187 lbs 189 lbs 10 oz -  BMI 39.09 38.27 -  Systolic 136 130  Diastolic 80 80 82  Pulse 81 74 88     Physical Exam  She is obese, almost morbidly obese.  Blood pressure is elevated.  She is alert and orientated without any focal logical signs.  I cannot feel the temporal artery on the right side or the left side.  Examination of her neck shows a slightly enlarged thyroid.  There were no cervical or supraclavicular lymphadenopathy.     Assessment   1. Acquired hypothyroidism   2. Multiple thyroid nodules   3. Nonintractable episodic headache, unspecified headache type   4. Malaise and fatigue   5. Screening for diabetes mellitus   6.  Screening for lipoid disorders   7. Vitamin D deficiency disease       Tests ordered Orders Placed This Encounter  Procedures  . CBC  . COMPLETE METABOLIC PANEL WITH GFR  . T3, free  . T4, free  . TSH  . Lipid panel  . Hemoglobin A1c  . VITAMIN D 25 Hydroxy (Vit-D Deficiency, Fractures)  . Sedimentation rate     Plan: 1. Blood work is ordered. 2. Explained to her the treatment of hypothyroidism and I will use, after discussion, NP thyroid 60 mg twice a day to see if this will help her symptoms.  We will check baseline thyroid function test today. 3. I will also check a sed rate to see if there is any validity to the possibility of temporal arteritis although I doubt this very much. 4. Blood pressure is borderline controlled so she will continue with the same dose for medications for hypertension. 5. I will see her in 1 week's time to discuss all the results and further recommendations.   Meds ordered this encounter  Medications  . NP THYROID 60 MG tablet    Sig: Take 1 tablet (60 mg total) by mouth in the morning and at bedtime.    Dispense:  60 tablet    Refill:  3    Yaqub Arney , MD

## 2021-01-22 LAB — COMPLETE METABOLIC PANEL WITH GFR
AG Ratio: 1.4 (calc) (ref 1.0–2.5)
ALT: 15 U/L (ref 6–29)
AST: 19 U/L (ref 10–30)
Albumin: 4.1 g/dL (ref 3.6–5.1)
Alkaline phosphatase (APISO): 87 U/L (ref 31–125)
BUN: 17 mg/dL (ref 7–25)
CO2: 23 mmol/L (ref 20–32)
Calcium: 9.1 mg/dL (ref 8.6–10.2)
Chloride: 105 mmol/L (ref 98–110)
Creat: 0.56 mg/dL (ref 0.50–1.10)
GFR, Est African American: 132 mL/min/{1.73_m2} (ref >=60)
GFR, Est Non African American: 114 mL/min/{1.73_m2} (ref >=60)
Globulin: 2.9 g/dL (calc) (ref 1.9–3.7)
Glucose, Bld: 149 mg/dL — ABNORMAL HIGH (ref 65–139)
Potassium: 3.5 mmol/L (ref 3.5–5.3)
Sodium: 138 mmol/L (ref 135–146)
Total Bilirubin: 0.3 mg/dL (ref 0.2–1.2)
Total Protein: 7 g/dL (ref 6.1–8.1)

## 2021-01-22 LAB — LIPID PANEL
Cholesterol: 176 mg/dL (ref <200)
HDL: 45 mg/dL — ABNORMAL LOW (ref >=50)
LDL Cholesterol (Calc): 105 mg/dL (calc) — ABNORMAL HIGH
Non-HDL Cholesterol (Calc): 131 mg/dL (calc) — ABNORMAL HIGH (ref <130)
Total CHOL/HDL Ratio: 3.9 (calc) (ref <5.0)
Triglycerides: 144 mg/dL (ref <150)

## 2021-01-22 LAB — SEDIMENTATION RATE: Sed Rate: 33 mm/h — ABNORMAL HIGH (ref 0–20)

## 2021-01-22 LAB — T3, FREE: T3, Free: 2.9 pg/mL (ref 2.3–4.2)

## 2021-01-22 LAB — CBC
HCT: 35.7 % (ref 35.0–45.0)
Hemoglobin: 11.9 g/dL (ref 11.7–15.5)
MCH: 29.1 pg (ref 27.0–33.0)
MCHC: 33.3 g/dL (ref 32.0–36.0)
MCV: 87.3 fL (ref 80.0–100.0)
MPV: 10.9 fL (ref 7.5–12.5)
Platelets: 324 10*3/uL (ref 140–400)
RBC: 4.09 10*6/uL (ref 3.80–5.10)
RDW: 14.5 % (ref 11.0–15.0)
WBC: 10.2 10*3/uL (ref 3.8–10.8)

## 2021-01-22 LAB — HEMOGLOBIN A1C
Hgb A1c MFr Bld: 5.6 % of total Hgb (ref <5.7)
Mean Plasma Glucose: 114 mg/dL
eAG (mmol/L): 6.3 mmol/L

## 2021-01-22 LAB — TSH: TSH: 1.37 mIU/L

## 2021-01-22 LAB — T4, FREE: Free T4: 1.3 ng/dL (ref 0.8–1.8)

## 2021-01-22 LAB — VITAMIN D 25 HYDROXY (VIT D DEFICIENCY, FRACTURES): Vit D, 25-Hydroxy: 28 ng/mL — ABNORMAL LOW (ref 30–100)

## 2021-01-28 ENCOUNTER — Encounter (INDEPENDENT_AMBULATORY_CARE_PROVIDER_SITE_OTHER): Payer: Self-pay | Admitting: Internal Medicine

## 2021-01-28 ENCOUNTER — Other Ambulatory Visit: Payer: Self-pay

## 2021-01-28 ENCOUNTER — Ambulatory Visit (INDEPENDENT_AMBULATORY_CARE_PROVIDER_SITE_OTHER): Payer: 59 | Admitting: Internal Medicine

## 2021-01-28 VITALS — BP 121/70 | HR 87 | Temp 97.5°F | Resp 18 | Ht <= 58 in | Wt 186.0 lb

## 2021-01-28 DIAGNOSIS — E559 Vitamin D deficiency, unspecified: Secondary | ICD-10-CM | POA: Diagnosis not present

## 2021-01-28 DIAGNOSIS — E039 Hypothyroidism, unspecified: Secondary | ICD-10-CM

## 2021-01-28 DIAGNOSIS — E669 Obesity, unspecified: Secondary | ICD-10-CM | POA: Diagnosis not present

## 2021-01-28 NOTE — Patient Instructions (Addendum)
VITAMIN D3 5000 UNITS,TAKE 2 PILLS DAILY.  [facility] Dietary Recommendations for Weight Loss What to Avoid . Avoid added sugars o Often added sugar can be found in processed foods such as many condiments, dry cereals, cakes, cookies, chips, crisps, crackers, candies, sweetened drinks, etc.  o Read labels and AVOID/DECREASE use of foods with the following in their ingredient list: Sugar, fructose, high fructose corn syrup, sucrose, glucose, maltose, dextrose, molasses, cane sugar, brown sugar, any type of syrup, agave nectar, etc.   . Avoid snacking in between meals . Avoid foods made with flour o If you are going to eat food made with flour, choose those made with whole-grains; and, minimize your consumption as much as is tolerable . Avoid processed foods o These foods are generally stocked in the middle of the grocery store. Focus on shopping on the perimeter of the grocery.  . Avoid Meat  o We recommend following a plant-based diet at [facility]. Thus, we recommend avoiding meat as a general rule. Consider eating beans, legumes, eggs, and/or dairy products for regular protein sources o If you plan on eating meat limit to 4 ounces of meat at a time and choose lean options such as Fish, chicken, [place]. Avoid red meat intake such as pork and/or steak What to Include . Vegetables o GREEN LEAFY VEGETABLES: Kale, spinach, mustard greens, collard greens, cabbage, broccoli, etc. o OTHER: Asparagus, cauliflower, eggplant, carrots, peas, Brussel sprouts, tomatoes, bell peppers, zucchini, beets, cucumbers, etc. . Grains, seeds, and legumes o Beans: kidney beans, black eyed peas, garbanzo beans, black beans, pinto beans, etc. o Whole, unrefined grains: brown rice, barley, bulgur, oatmeal, etc. . Healthy fats  o Avoid highly processed fats such as vegetable oil o Examples of healthy fats: avocado, olives, virgin olive oil, dark chocolate (?72% Cocoa), nuts (peanuts, almonds,  walnuts, cashews, pecans, etc.) . None to Low Intake of Animal Sources of Protein o Meat sources: chicken, [place], salmon, tuna. Limit to 4 ounces of meat at one time. o Consider limiting dairy sources, but when choosing dairy focus on: PLAIN [place] yogurt, cottage cheese, high-protein milk . Fruit o Choose berries  When to Eat . Intermittent Fasting: o Choosing not to eat for a specific time period, but DO FOCUS ON HYDRATION when fasting o Multiple Techniques: - Time Restricted Eating: eat 3 meals in a day, each meal lasting no more than 60 minutes, no snacks between meals - 16-18 hour fast: fast for 16 to 18 hours up to 7 days a week. Often suggested to start with 2-3 nonconsecutive days per week.  . Remember the time you sleep is counted as fasting.  . Examples of eating schedule: Fast from 7:00pm-11:00am. Eat between 11:00am-7:00pm.  - 24-hour fast: fast for 24 hours up to every other day. Often suggested to start with 1 day per week . Remember the time you sleep is counted as fasting . Examples of eating schedule:  o Eating day: eat 2-3 meals on your eating day. If doing 2 meals, each meal should last no more than 90 minutes. If doing 3 meals, each meal should last no more than 60 minutes. Finish last meal by 7:00pm. o Fasting day: Fast until 7:00pm.  o IF YOU FEEL UNWELL FOR ANY REASON/IN ANY WAY WHEN FASTING, STOP FASTING BY EATING A NUTRITIOUS SNACK OR LIGHT MEAL o ALWAYS FOCUS ON HYDRATION DURING FASTS - Acceptable Hydration sources: water, broths, tea/coffee (black tea/coffee is best but using a small amount of whole-fat  dairy products in coffee/tea is acceptable).  - Poor Hydration Sources: anything with sugar or artificial sweeteners added to it  These recommendations have been developed for patients that are actively receiving medical care from either Dr. Karilyn Cota or Jiles Prows, DNP, NP-C at [facility]. These recommendations are developed for patients with specific  medical conditions and are not meant to be distributed or used by others that are not actively receiving care from either provider listed above at [facility]. It is not appropriate to participate in the above eating plans without proper medical supervision.   Reference: Lawrence Santiago. The obesity code. [place]; 2016.

## 2021-01-28 NOTE — Progress Notes (Signed)
Metrics: Intervention Frequency ACO  Documented Smoking Status Yearly  Screened one or more times in 24 months  Cessation Counseling or  Active cessation medication Past 24 months  Past 24 months   Guideline developer: UpToDate (See UpToDate for funding source) Date Released: 2014       Wellness Office Visit  Subjective:  Patient ID: Meagan Reynolds, female    DOB: April 18, 1976  Age: 45 y.o. MRN: [id number]  CC: This lady comes in for follow-up of her blood work and further recommendations. HPI  Since starting the NP thyroid 60 mg twice a day, she feels she has more energy. I reviewed all her blood work with her.  She has significant vitamin D deficiency.  She has some dyslipidemia with low HDL levels.  Sed rate is not impressively elevated to account for temporal arteritis headaches.  She tells me that the headaches are now on the left side compared to the right side previously.  They appear to be less frequent. Past Medical History:  Diagnosis Date  . Anemia   . Anxiety   . Depression   . Diabetes mellitus without complication (HCC)    gestational  . Gestational diabetes 05/07/2016  . Hypertension   . Thyroid disease    Past Surgical History:  Procedure Laterality Date  . CHOLECYSTECTOMY  2004  . TUBAL LIGATION       Family History  Problem Relation Age of Onset  . Hyperlipidemia Mother   . Hypertension Mother   . Diabetes Mother   . Heart disease Father 70       MI  . Hyperthyroidism Sister   . 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   Social History   Tobacco Use  . Smoking status: Former Smoker    Packs/day: 0.10    Types: Cigarettes    Quit date: 10/24/2020    Years since quitting: 0.2  . Smokeless tobacco: Never Used  . Tobacco comment: 2 cigarettes daily   Substance Use  Topics  . Alcohol use: No    Current Meds  Medication Sig  . Ascorbic Acid (VITAMIN C) 1000 MG tablet Take 1,000 mg by mouth daily.  . bisoprolol-hydrochlorothiazide (ZIAC) 5-6.25 MG tablet Take 1 tablet by mouth daily.  . ferrous sulfate 324 MG TBEC Take 324 mg by mouth in the morning and at bedtime.  . NP THYROID 60 MG tablet Take 1 tablet (60 mg total) by mouth in the morning and at bedtime.  . promethazine (PHENERGAN) 25 MG tablet Take 0.5 tablets (12.5 mg total) by mouth every 8 (eight) hours as needed for nausea or vomiting.  . rizatriptan (MAXALT) 10 MG tablet Take 10 mg by mouth as needed for migraine. May repeat in 2 hours if needed  . topiramate (TOPAMAX) 25 MG tablet Take 25 mg by mouth 2 (two) times daily.  . [DISCONTINUED] levothyroxine (SYNTHROID) 150 MCG tablet Take 150 mcg by mouth daily before breakfast.       Objective:   Today's Vitals: BP 121/70 (BP Location: Left Arm, Patient Position: Sitting, Cuff Size: Normal)   Pulse 87   Temp (!) 97.5 F (36.4 C)   Resp 18   Ht 4\' 10"  (1.473 m)   Wt 186 lb (84.4 kg)   SpO2 99%   BMI 38.87 kg/m  Vitals with BMI 01/28/2021 01/21/2021  10/24/2020  Height 4\' 10"  4\' 10"  4\' 11"   Weight 186 lbs 187 lbs 189 lbs 10 oz  BMI 38.88 39.09 38.27  Systolic 121 136  Diastolic 70 80 80  Pulse 87 81 74     Physical Exam  She remains obese.  She has lost about a pound since last time I saw her.  Blood pressure is better.     Assessment   1. Acquired hypothyroidism   2. Vitamin D deficiency disease   3. Obesity (BMI 30-39.9)       Tests ordered No orders of the defined types were placed in this encounter.    Plan: 1. Continue with NP thyroid 60 mg twice a day and have given her samples to also take an additional NP thyroid 30 mg twice a day also.  If she tolerates this, I will give her a prescription for NP thyroid 90 mg twice a day. 2. I recommended she start taking vitamin D3 10,000 units daily. 3. Today I  discussed nutrition with her and the concept of intermittent fasting combined with a plant-based diet, ensuring adequate hydration.  I have given her diet sheet. 4. Follow-up in about 6 weeks to see how she is doing and we will likely check blood work then.   No orders of the defined types were placed in this encounter.   , MD

## 2021-02-21 ENCOUNTER — Ambulatory Visit (INDEPENDENT_AMBULATORY_CARE_PROVIDER_SITE_OTHER): Payer: 59 | Admitting: Internal Medicine

## 2021-02-21 ENCOUNTER — Other Ambulatory Visit (INDEPENDENT_AMBULATORY_CARE_PROVIDER_SITE_OTHER): Payer: Self-pay | Admitting: Internal Medicine

## 2021-06-07 ENCOUNTER — Ambulatory Visit: Payer: 59 | Admitting: Cardiology

## 2021-06-07 NOTE — Progress Notes (Deleted)
Clinical Summary Meagan Reynolds is a 45 y.o.female  1. Chest pain - 1 month of chest pains - midchest. Pressure like feeling, can be 6/10  In severity. Can have jabbing like pain in that area.  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 Some associated DOE   CAD risk factors: HTN, tobacco 1 cig per day     11/2020 nuclear stress: no ischemia  2. Panic/anxiety - follwoed by pcp Past Medical History:  Diagnosis Date   Anemia    Anxiety    Depression    Diabetes mellitus without complication (HCC)    gestational   Gestational diabetes 05/07/2016   Hypertension    Thyroid disease      No Known Allergies   Current Outpatient Medications  Medication Sig Dispense Refill   Ascorbic Acid (VITAMIN C) 1000 MG tablet Take 1,000 mg by mouth daily.     bisoprolol-hydrochlorothiazide (ZIAC) 5-6.25 MG tablet Take 1 tablet by mouth daily.     ferrous sulfate 324 MG TBEC Take 324 mg by mouth in the morning and at bedtime.     NP THYROID 60 MG tablet Take 1 tablet (60 mg total) by mouth in the morning and at bedtime. 60 tablet 3   promethazine (PHENERGAN) 25 MG tablet Take 0.5 tablets (12.5 mg total) by mouth every 8 (eight) hours as needed for nausea or vomiting. 4 tablet 0   rizatriptan (MAXALT) 10 MG tablet Take 10 mg by mouth as needed for migraine. May repeat in 2 hours if needed     topiramate (TOPAMAX) 25 MG tablet Take 25 mg by mouth 2 (two) times daily.     No current facility-administered medications for this visit.     Past Surgical History:  Procedure Laterality Date   CHOLECYSTECTOMY  2004   TUBAL LIGATION       No Known Allergies    Family History  Problem Relation Age of Onset   Hyperlipidemia Mother    Hypertension Mother    Diabetes Mother    Heart disease Father 79       MI   Hyperthyroidism Sister    Diabetes Maternal Grandmother      Social  History Meagan Reynolds reports that she quit smoking about 7 months ago. Her smoking use included cigarettes. She smoked an average of .1 packs per day. She has never used smokeless tobacco. Meagan Reynolds reports no history of alcohol use.   Review of Systems CONSTITUTIONAL: No weight loss, fever, chills, weakness or fatigue.  HEENT: Eyes: No visual loss, blurred vision, double vision or yellow sclerae.No hearing loss, sneezing, congestion, runny nose or sore throat.  SKIN: No rash or itching.  CARDIOVASCULAR:  RESPIRATORY: No shortness of breath, cough or sputum.  GASTROINTESTINAL: No anorexia, nausea, vomiting or diarrhea. No abdominal pain or blood.  GENITOURINARY: No burning on urination, no polyuria NEUROLOGICAL: No headache, dizziness, syncope, paralysis, ataxia, numbness or tingling in the extremities. No change in bowel or bladder control.  MUSCULOSKELETAL: No muscle, back pain, joint pain or stiffness.  LYMPHATICS: No enlarged nodes. No history of splenectomy.  PSYCHIATRIC: No history of depression or anxiety.  ENDOCRINOLOGIC: No reports of sweating, cold or heat intolerance. No polyuria or polydipsia.  Marland Kitchen   Physical Examination There were no vitals filed for this visit. There were no vitals filed for this visit.  Gen: resting  comfortably, no acute distress HEENT: no scleral icterus, pupils equal round and reactive, no palptable cervical adenopathy,  CV Resp: Clear to auscultation bilaterally GI: abdomen is soft, non-tender, non-distended, normal bowel sounds, no hepatosplenomegaly MSK: extremities are warm, no edema.  Skin: warm, no rash Neuro:  no focal deficits Psych: appropriate affect   Diagnostic Studies  11/2020 nuclear stress Clinically equivical Electrically negative for ischemia. Good exercise tolerance Myovue scan with normal perfusion No ischemia or scar LVEF 73% Overall low risk study.   Assessment and Plan  1. Chest pain/SOB - unclear etiology, will plan  for an exercise nuclear stress test - EKG today shows NSR, no ischemic changes      Antoine Poche, M.D.

## 2022-01-15 DIAGNOSIS — F4010 Social phobia, unspecified: Secondary | ICD-10-CM | POA: Insufficient documentation

## 2022-01-15 DIAGNOSIS — F3341 Major depressive disorder, recurrent, in partial remission: Secondary | ICD-10-CM | POA: Insufficient documentation

## 2022-01-15 DIAGNOSIS — I10 Essential (primary) hypertension: Secondary | ICD-10-CM | POA: Insufficient documentation

## 2022-01-15 DIAGNOSIS — F332 Major depressive disorder, recurrent severe without psychotic features: Secondary | ICD-10-CM | POA: Insufficient documentation

## 2022-01-15 DIAGNOSIS — G43909 Migraine, unspecified, not intractable, without status migrainosus: Secondary | ICD-10-CM | POA: Insufficient documentation

## 2022-06-23 DIAGNOSIS — R69 Illness, unspecified: Secondary | ICD-10-CM | POA: Diagnosis not present

## 2022-07-02 DIAGNOSIS — I10 Essential (primary) hypertension: Secondary | ICD-10-CM | POA: Diagnosis not present

## 2022-07-02 DIAGNOSIS — R7303 Prediabetes: Secondary | ICD-10-CM | POA: Diagnosis not present

## 2022-07-02 DIAGNOSIS — D509 Iron deficiency anemia, unspecified: Secondary | ICD-10-CM | POA: Diagnosis not present

## 2022-07-02 DIAGNOSIS — E039 Hypothyroidism, unspecified: Secondary | ICD-10-CM | POA: Diagnosis not present

## 2022-07-08 DIAGNOSIS — R69 Illness, unspecified: Secondary | ICD-10-CM | POA: Diagnosis not present

## 2022-07-08 DIAGNOSIS — R7303 Prediabetes: Secondary | ICD-10-CM | POA: Diagnosis not present

## 2022-07-08 DIAGNOSIS — I10 Essential (primary) hypertension: Secondary | ICD-10-CM | POA: Diagnosis not present

## 2022-07-08 DIAGNOSIS — E039 Hypothyroidism, unspecified: Secondary | ICD-10-CM | POA: Diagnosis not present

## 2022-07-08 DIAGNOSIS — G43009 Migraine without aura, not intractable, without status migrainosus: Secondary | ICD-10-CM | POA: Diagnosis not present

## 2022-07-08 DIAGNOSIS — D509 Iron deficiency anemia, unspecified: Secondary | ICD-10-CM | POA: Diagnosis not present

## 2022-07-08 DIAGNOSIS — H9212 Otorrhea, left ear: Secondary | ICD-10-CM | POA: Diagnosis not present

## 2022-08-06 DIAGNOSIS — Z79899 Other long term (current) drug therapy: Secondary | ICD-10-CM | POA: Diagnosis not present

## 2022-11-13 ENCOUNTER — Encounter (HOSPITAL_COMMUNITY): Payer: Self-pay | Admitting: Psychiatry

## 2022-11-13 ENCOUNTER — Ambulatory Visit (INDEPENDENT_AMBULATORY_CARE_PROVIDER_SITE_OTHER): Payer: 59 | Admitting: Psychiatry

## 2022-11-13 DIAGNOSIS — F411 Generalized anxiety disorder: Secondary | ICD-10-CM | POA: Diagnosis not present

## 2022-11-13 DIAGNOSIS — F4010 Social phobia, unspecified: Secondary | ICD-10-CM

## 2022-11-13 DIAGNOSIS — E039 Hypothyroidism, unspecified: Secondary | ICD-10-CM

## 2022-11-13 DIAGNOSIS — F4310 Post-traumatic stress disorder, unspecified: Secondary | ICD-10-CM | POA: Diagnosis not present

## 2022-11-13 DIAGNOSIS — D509 Iron deficiency anemia, unspecified: Secondary | ICD-10-CM

## 2022-11-13 DIAGNOSIS — Z789 Other specified health status: Secondary | ICD-10-CM

## 2022-11-13 DIAGNOSIS — F410 Panic disorder [episodic paroxysmal anxiety] without agoraphobia: Secondary | ICD-10-CM | POA: Diagnosis not present

## 2022-11-13 DIAGNOSIS — F332 Major depressive disorder, recurrent severe without psychotic features: Secondary | ICD-10-CM

## 2022-11-13 DIAGNOSIS — Z9151 Personal history of suicidal behavior: Secondary | ICD-10-CM | POA: Diagnosis not present

## 2022-11-13 DIAGNOSIS — E559 Vitamin D deficiency, unspecified: Secondary | ICD-10-CM

## 2022-11-13 HISTORY — DX: Other specified health status: Z78.9

## 2022-11-13 MED ORDER — DULOXETINE HCL 20 MG PO CPEP: 20.0000 mg | ORAL_CAPSULE | Freq: Every day | ORAL | 0 refills | Status: DC

## 2022-11-13 NOTE — Progress Notes (Signed)
Psychiatric Initial Adult Assessment  Patient Identification: Meagan Reynolds MRN:  [id number] Date of Evaluation:  11/13/2022 Referral Source: PCP  Assessment:  Meagan Reynolds is a 47 y.o. female with a history of PTSD from the death of her child after being delivered in 12-11[birth year]03, major depressive disorder with 2 lifetime suicide attempts, generalized anxiety disorder with panic attacks, social anxiety disorder, hypothyroidism, vitamin D deficiency, iron deficiency anemia who presents to [facility] via video conferencing for initial evaluation of depression and anxiety.  Patient reports lifelong higher levels of anxiety having grown up in a household where parents discouraged communicating with anyone outside of the family and never inviting any people over.  Similarly reports depression and anxiety in her siblings having grown up in the same household.  She denies any trauma history but struggles in crowds and being around people.  Some paranoia that someone will try to break into her home though this has never happened before.  She had several medication trials but all were ineffective including fluoxetine, sertraline, buspirone, venlafaxine.  There was one medication that she was told she could become addicted to but cannot over the name of this medication and unclear if it was Topamax or not.  There is some worsening of her mood and the lead up to her.  But she reports her mood is generally poor for most of the month as well.  She was amenable to trial of Cymbalta as next SNRI agent but if this is unsuccessful as previous medication trials have been we will need to consider more unique mechanism of action moving forward.  There are several physical reasons for her poor mood as well as her vitamin D level was low but unclear supplementation after finding this and has an iron deficiency anemia but her B12 and folate have not been checked in some time.  Additionally her previous thyroid  medication provider left the practice and she is still getting levels sorted out.  We will have her call her insurer to find therapist that is in network for CBT to address the strong thought component of her anxiety and depression.  Her depression does worsen when she thinks of delivering her son who died in 12/11/03 after being born and she does qualify for PTSD from this event.  While she has had 2 suicide attempts, the first being after this death of her child, she is contracting for safety and has no intent when the thoughts do come up; see safety assessment below.  She smokes 1 cigarette/day and is precontemplative with regard to change.  Follow-up in 1 month.  For safety, her acute risk factors for suicide are: Current diagnosis of depression.  Her chronic risk factors for suicide are: Prior death of child, past suicide attempts by overdose, chronic mental illness.  Her protective factors are: Beloved pets living in the home, minor children living in the home, supportive family, employment, actively seeking and engaging with mental health care, contracting for safety, lack of intent with suicidal ideation, no suicidal ideation present today, hope for the future.  While future events cannot be fully predicted, she does not currently meet IVC criteria and can be continued as an outpatient.  Plan:  # PTSD Past medication trials: See med trials below Status of problem: New to provider Interventions: -- Discontinue Effexor --Start Cymbalta 20 mg once daily (S3/7/24) --Patient to call insurer to find CBT provider that is in network --Consider prazosin in the future  # Major depressive disorder, recurrent,  severe without psychotic features with intermittent SI with plan without intent  2 lifetime suicide attempts by overdose Past medication trials:  Status of problem: New to provider Interventions: -- Cymbalta, CBT as above  # Generalized anxiety disorder with panic attacks  social anxiety  disorder  caffeine overuse Past medication trials:  Status of problem: New to provider Interventions: -- Cymbalta, CBT as above --Patient to cut back on caffeine use  # Vitamin D deficiency with hair loss  iron deficiency anemia  hypothyroidism Past medication trials:  Status of problem: New to provider Interventions: -- Continue levothyroxine per PCP --Coordinate with PCP for recheck of vitamin D, B12, folate, iron panel  # Tobacco use disorder Past medication trials:  Status of problem: New to provider Interventions: -- Tobacco cessation counseling provided  Patient was given contact information for behavioral health clinic and was instructed to call 911 for emergencies.   Subjective:  Chief Complaint:  Chief Complaint  Patient presents with   Anxiety   Depression   Establish Care    History of Present Illness:  Goes by Meagan Reynolds really it is Meagan Reynolds. Has had social anxiety for years and depression. Thinks they are getting worse.   Lives with husband, daughter, son, and daughter's boyfriend (2 and 49 respectively). Two dogs of daughter's and 1 of son; rabbit and [place] pig. Everyone getting along. Works with husband who is self employed. Tries to stay busy to try and not be depressed. Lost her baby in 2005 and feels better when compared to then but will have flashbacks. Was [redacted] weeks pregnant but he wasn't fully developed. For fun, prefers being with her husband and doesn't have too many out of work contacts. Avoids crowds. But not much is fun for her. Feels like people stare at her when she goes to the store. When not worrying, she sleeps well. From 11p to 7:30a but never feels rested. Has anemia and thyroid problems so always tired. Husband says she snores. Will have coffee three times per week but drinks Pepsi or Dr. Malachi Bonds 2 can per day, last being at dinner. Has been gaining weight but concerned thyroid isn't well controlled. Doesn't binge, can gain weight easily. Eats 3 meals  per day. No restriction or purging. Concentration is poor but lifelong; sisters were like that too growing up. Used to struggle more with guilt feelings. Fidgety. When depression hits does have thoughts of SI and can get irritated easily. Tries not to think about overdosing. Gets irritated every day but SI happens 2-3x per month.   Chronic worry across multiple domains with impact on sleep and muscle tension. Panic attacks happen infrequently. No period of sleeplessness. Sometimes thinks she hears things that others aren't hearing but never voices. Paranoia that someone will try to get into the house so she keeps a bat near the door. No alcohol at any point. Smokes 1 cigarette per day. No other drugs. Denies trauma. Mood does worsen in lead up to her period but is also present for the rest of the month.    Past Psychiatric History:  Diagnoses: depression and social anxiety Medication trials: fluoxetine (ineffective), zoloft (ineffective), sertraline (ineffective), buspirone (ineffective), venlafaxine (ineffective but never higher than 37.'5mg'$ ), topamax (ineffective) Previous psychiatrist/therapist: yes but insurance didn't cover so only saw once Hospitalizations: none Suicide attempts: overdosed as a teenager and overdosed when her baby died; not hospitalized for either because didn't seek help. SIB: none Hx of violence towards others: none Current access to guns: none Hx of  abuse: none  Previous Psychotropic Medications: Yes   Substance Abuse History in the last 12 months:  No.  Past Medical History:  Past Medical History:  Diagnosis Date   Agoraphobia with panic disorder 05/07/2016   Anemia    Anxiety    Depression    Diabetes mellitus without complication ([place])    gestational   Gestational diabetes 05/07/2016   Hypertension    Thyroid disease     Past Surgical History:  Procedure Laterality Date   CHOLECYSTECTOMY  2004   TUBAL LIGATION      Family Psychiatric History: mother  with depression, two sisters with anxiety and depression, grew up with parents saying never talk to anyone  Family History:  Family History  Problem Relation Age of Onset   Hyperlipidemia Mother    Hypertension Mother    Diabetes Mother    Heart disease Father 49       MI   Hyperthyroidism Sister    Diabetes Maternal Grandmother     Social History:   Social History   Socioeconomic History   Marital status: Married    Spouse name: Not on file   Number of children: Not on file   Years of education: Not on file   Highest education level: Not on file  Occupational History   Not on file  Tobacco Use   Smoking status: Every Day    Packs/day: 0.10    Types: Cigarettes    Last attempt to quit: 10/24/2020    Years since quitting: 2.0   Smokeless tobacco: Never   Tobacco comments:    1 cigarettes daily   Vaping Use   Vaping Use: Never used  Substance and Sexual Activity   Alcohol use: No   Drug use: No   Sexual activity: Yes    Birth control/protection: Surgical  Other Topics Concern   Not on file  Social History Narrative   Married since 2003.Lives with husband and kids.Works with husband in [place].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   Social Determinants of Health   Financial Resource Strain: Not on file  Food Insecurity: Not on file  Transportation Needs: Not on file  Physical Activity: Not on file  Stress: Not on file  Social Connections: Not on file    Additional Social History: See HPI  Allergies:  No Known Allergies  Current Medications: Current Outpatient Medications  Medication Sig Dispense Refill   levothyroxine (SYNTHROID) 75 MCG tablet Take 75 mcg by mouth daily before breakfast.     bisoprolol-hydrochlorothiazide (ZIAC) 5-6.25 MG tablet Take 1 tablet by mouth daily.     ferrous sulfate 324 MG TBEC Take 324 mg by mouth in the morning and at bedtime.     venlafaxine XR (EFFEXOR-XR) 37.5 MG 24 hr capsule Take 37.5 mg by  mouth every morning.     No current facility-administered medications for this visit.    ROS: Review of Systems  Constitutional:  Negative for appetite change and unexpected weight change.  Gastrointestinal:  Negative for constipation, diarrhea, nausea and vomiting.  Endocrine: Positive for cold intolerance. Negative for heat intolerance and polyphagia.  Skin:        Hair loss  Neurological:  Positive for headaches. Negative for dizziness.  Psychiatric/Behavioral:  Positive for decreased concentration and dysphoric mood. Negative for hallucinations, self-injury, sleep disturbance and suicidal ideas. The patient is nervous/anxious.     Objective:  Psychiatric Specialty Exam: There were no vitals taken for this visit.There is no  height or weight on file to calculate BMI.  General Appearance: Casual, Fairly Groomed, and appears stated age  Eye Contact:  Good  Speech:  Clear and Coherent and Normal Rate  Volume:  Normal  Mood:  Anxious and Depressed  Affect:  Appropriate, Congruent, Constricted, Depressed, and anxious  Thought Content: Logical, Hallucinations: None, and Paranoid Ideation that someone will try to get into her house  Suicidal Thoughts:   None at this time but suicidal ideation with plan of overdose comes up to 3 times per month  Homicidal Thoughts:  No  Thought Process:  Coherent, Goal Directed, and Linear  Orientation:  Full (Time, Place, and Person)    Memory:  Immediate;   Fair Recent;   Fair Remote;   Fair  Judgment:  Fair  Insight:  Fair  Concentration:  Concentration: Fair and Attention Span: Fair  Recall:  [place] of Knowledge: Good  Language: Good  Psychomotor Activity:   Fidgety  Akathisia:  No  AIMS (if indicated): not done  Assets:  Communication Skills Desire for Improvement Financial Resources/Insurance Housing Intimacy Leisure Time Physical Health Resilience Social Support Talents/Skills Transportation Vocational/Educational  ADL's:   Intact  Cognition: WNL  Sleep:  Good   PE: General: sits comfortably in view of camera; no acute distress  Pulm: no increased work of breathing on room air  MSK: all extremity movements appear intact Neuro: no focal neurological deficits observed  Gait & Station: unable to assess by video    Metabolic Disorder Labs: Lab Results  Component Value Date   HGBA1C 5.6 01/21/2021   MPG 114 01/21/2021   MPG 103 05/07/2016   No results found for: "PROLACTIN" Lab Results  Component Value Date   CHOL 176 01/21/2021   TRIG 144 01/21/2021   HDL 45 (L) 01/21/2021   CHOLHDL 3.9 01/21/2021   LDLCALC 105 (H) 01/21/2021   Lab Results  Component Value Date   TSH 1.37 01/21/2021    Therapeutic Level Labs: No results found for: "LITHIUM" No results found for: "CBMZ" No results found for: "VALPROATE"  Screenings:  [place] Office Visit from 11/13/2022 in [facility] Visit from 01/21/2021 in [facility]  PHQ-2 Total Score 6 0  PHQ-9 Total Score 14 --      [place] Office Visit from 11/13/2022 in [facility] from 10/21/2020 in [facility] [place] Moderate Risk No Risk       Collaboration of Care: Collaboration of Care: Medication Management AEB as above, Primary Care Provider AEB as above, and Referral or follow-up with counselor/therapist AEB as above  Patient/Guardian was advised Release of Information must be obtained prior to any record release in order to collaborate their care with an outside provider. Patient/Guardian was advised if they have not already done so to contact the registration department to sign all necessary forms in order for [place] to release information regarding their care.   Consent: Patient/Guardian gives verbal consent for treatment and assignment of benefits for services provided  during this visit. Patient/Guardian expressed understanding and agreed to proceed.   Televisit via video: I connected with Meagan Reynolds on 11/13/22 at  3:00 PM EST by a video enabled telemedicine application and verified that I am speaking with the correct person using two identifiers.  Location: Patient: [place] at home Provider: home office   I discussed the  limitations of evaluation and management by telemedicine and the availability of in person appointments. The patient expressed understanding and agreed to proceed.  I discussed the assessment and treatment plan with the patient. The patient was provided an opportunity to ask questions and all were answered. The patient agreed with the plan and demonstrated an understanding of the instructions.   The patient was advised to call back or seek an in-person evaluation if the symptoms worsen or if the condition fails to improve as anticipated.  I provided 60 minutes of non-face-to-face time during this encounter.  Jacquelynn Cree, MD 3/7/20244:07 PM

## 2022-11-13 NOTE — Patient Instructions (Addendum)
We discontinued the venlafaxine today in favor of starting Cymbalta (duloxetine) at 20 mg once daily.  If you can start cutting back on the amount of caffeine you take by half a bottle every 5 days with a goal of thing 1 bottle of soda or coffee once daily in the morning and none after 12 PM.  This should help with your panic attacks and anxiety overall.  Coordinate with your doctor to check your vitamin D, B12, folate, iron panel as this can help your mood as well if you get on supplementation if they are low.  Be sure to call your insurer to find a CBT provider that is in network so we can begin addressing the thought component of your anxiety and depression.

## 2022-12-15 ENCOUNTER — Encounter (HOSPITAL_COMMUNITY): Payer: Self-pay | Admitting: Psychiatry

## 2022-12-15 ENCOUNTER — Telehealth (INDEPENDENT_AMBULATORY_CARE_PROVIDER_SITE_OTHER): Payer: 59 | Admitting: Psychiatry

## 2022-12-15 DIAGNOSIS — F4310 Post-traumatic stress disorder, unspecified: Secondary | ICD-10-CM

## 2022-12-15 DIAGNOSIS — E559 Vitamin D deficiency, unspecified: Secondary | ICD-10-CM

## 2022-12-15 DIAGNOSIS — F410 Panic disorder [episodic paroxysmal anxiety] without agoraphobia: Secondary | ICD-10-CM | POA: Diagnosis not present

## 2022-12-15 DIAGNOSIS — F332 Major depressive disorder, recurrent severe without psychotic features: Secondary | ICD-10-CM | POA: Diagnosis not present

## 2022-12-15 DIAGNOSIS — F4010 Social phobia, unspecified: Secondary | ICD-10-CM | POA: Diagnosis not present

## 2022-12-15 DIAGNOSIS — Z789 Other specified health status: Secondary | ICD-10-CM

## 2022-12-15 DIAGNOSIS — F411 Generalized anxiety disorder: Secondary | ICD-10-CM | POA: Diagnosis not present

## 2022-12-15 MED ORDER — DULOXETINE HCL 40 MG PO CPEP
40.0000 mg | ORAL_CAPSULE | Freq: Every day | ORAL | 2 refills | Status: DC
Start: 2022-12-15 — End: 2023-01-15

## 2022-12-15 NOTE — Patient Instructions (Addendum)
Here are the blood tests we talked about: vitamin D, B12, folate, iron panel.  We also increased the Cymbalta from 20 to 40 mg once daily.

## 2022-12-15 NOTE — Progress Notes (Signed)
BH MD Outpatient Progress Note  12/15/2022 4:35 PM Meagan Reynolds  MRN:  [id number]  Assessment:  Lucius Conn presents for follow-up evaluation. Today, 12/15/22, patient reports some improvement to suicidal ideation with being less frequent and less intense than it was at time of initial appointment as well as fewer crying episodes noting only 1 since first appointment.  Physically he is tolerating the Cymbalta well and after switching from taking at night which caused impairment to sleep has not noted no issues with taking in the morning.  Is sleeping well despite continued dinnertime caffeinated soda and attempts at cutting back proved to difficult and maintained 2 sodas per day.  She smokes 1 cigarette/day and is precontemplative with regard to change.  Given that she is still having suicidal ideation with some regularity we will titrate Cymbalta as outlined in plan below but this is already proving more effective than prior medication trials.  She has not gotten blood work yet but will when she sees her PCP next in May.  Needs to call back insurer to find CBT provider that is in network.  Follow-up in 1 month.   For safety, her acute risk factors for suicide are: Current diagnosis of depression, intermittent SI.  Her chronic risk factors for suicide are: Prior death of child, past suicide attempts by overdose, chronic mental illness.  Her protective factors are: Beloved pets living in the home, minor children living in the home, supportive family, employment, actively seeking and engaging with mental health care, contracting for safety, lack of intent with suicidal ideation, no suicidal ideation present today, hope for the future.  While future events cannot be fully predicted, she does not currently meet IVC criteria and can be continued as an outpatient.  Identifying Information: Meagan Reynolds (pronounced [name]) is a 47 y.o. female with a history of PTSD from the death of her child after being  delivered in Jan 20, 2004, major depressive disorder with 2 lifetime suicide attempts, generalized anxiety disorder with panic attacks, social anxiety disorder, hypothyroidism, vitamin D deficiency, iron deficiency anemia who is an established patient with [facility] participating in follow-up via video conferencing. Initial evaluation of depression and anxiety on 11/13/22; please see that note for full case discussion.  She denied any trauma history but struggles in crowds and being around people.  Some paranoia that someone will try to break into her home though this has never happened before.  She had several medication trials but all were ineffective including fluoxetine, sertraline, buspirone, venlafaxine.  There was one medication that she was told she could become addicted to but cannot over the name of this medication and unclear if it was Topamax or not.  There was some worsening of her mood and the lead up to her menses.  But she reported her mood is generally poor for most of the month as well.  She was amenable to trial of Cymbalta as next SNRI agent.  There are several physical reasons for her poor mood as well as her vitamin D level was low but unclear supplementation after finding this and has an iron deficiency anemia but her B12 and folate had not been checked in some time.  Additionally her previous thyroid medication provider left the practice and she was still getting levels sorted out.   Her depression did worsen when she thought of delivering her son who died in 2004/01/20 after being born and she does qualify for PTSD from this event.  While she had 2 suicide  attempts, the first being after this death of her child, she was contracting for safety and had no intent when the thoughts did come up.      Plan:   # PTSD Past medication trials: See med trials below Status of problem: Improving Interventions: -- Titrate Cymbalta 20 mg once daily (S3/7/24, i4/8/24) --Patient to call  insurer to find CBT provider that is in network --Consider prazosin in the future   # Major depressive disorder, recurrent, severe without psychotic features with intermittent SI with plan without intent  2 lifetime suicide attempts by overdose Past medication trials:  Status of problem: Improving Interventions: -- Cymbalta, CBT as above   # Generalized anxiety disorder with panic attacks  social anxiety disorder  caffeine overuse Past medication trials:  Status of problem: Improving Interventions: -- Cymbalta, CBT as above --Patient to cut back on caffeine use   # Vitamin D deficiency with hair loss  iron deficiency anemia  hypothyroidism Past medication trials:  Status of problem: Chronic and stable Interventions: -- Continue levothyroxine per PCP --Coordinate with PCP for recheck of vitamin D, B12, folate, iron panel   # Tobacco use disorder Past medication trials:  Status of problem: Chronic and stable Interventions: -- Tobacco cessation counseling provided  Patient was given contact information for behavioral health clinic and was instructed to call 911 for emergencies.   Subjective:  Chief Complaint:  Chief Complaint  Patient presents with   Anxiety   Depression   Follow-up   Stress    Interval History: Thinks things have been the same since last visit. Has been primarily working, nothing new or excited. Was taking at night and unable to sleep but switched to morning and sleeping a lot better. Sleeping from 1030p - 730a. No worsening of anxiety or headaches/changes to bowel movements. The caffeine change led to headaches. Will have coke at lunch and dinner. Will see PCP again in May when she gets her thyroid rechecked and didn't have her blood checked since last appointment. Still smoking 1 cigarette per night. With further reflection does think the cymbalta is helping because she is getting the SI less and isn't crying as much. Insurer said they would call her  back to see if they would cover any CBT so she will call back.   Visit Diagnosis:    ICD-10-CM   1. Caffeine overuse  Z78.9     2. Social anxiety disorder  F40.10 DULoxetine 40 MG CPEP    3. PTSD (post-traumatic stress disorder)  F43.10 DULoxetine 40 MG CPEP    4. Major depressive disorder, recurrent severe without psychotic features  F33.2 DULoxetine 40 MG CPEP    5. Generalized anxiety disorder with panic attacks  F41.1 DULoxetine 40 MG CPEP   F41.0     6. Vitamin D deficiency  E55.9       Past Psychiatric History:  Diagnoses: PTSD from the death of her child after being delivered in 2005, major depressive disorder with 2 lifetime suicide attempts, generalized anxiety disorder with panic attacks, social anxiety disorder Medication trials: fluoxetine (ineffective), zoloft (ineffective), sertraline (ineffective), buspirone (ineffective), venlafaxine (ineffective but never higher than 37.5mg ), topamax (ineffective) Previous psychiatrist/therapist: yes but insurance didn't cover so only saw once Hospitalizations: none Suicide attempts: overdosed as a teenager and overdosed when her baby died; not hospitalized for either because didn't seek help. SIB: none Hx of violence towards others: none Current access to guns: none Hx of abuse: none Substance use: none  Past Medical History:  Past Medical History:  Diagnosis Date   Agoraphobia with panic disorder 05/07/2016   Anemia    Anxiety    Depression    Diabetes mellitus without complication    gestational   Gestational diabetes 05/07/2016   Hypertension    Thyroid disease     Past Surgical History:  Procedure Laterality Date   CHOLECYSTECTOMY  2004   TUBAL LIGATION      Family Psychiatric History: mother with depression, two sisters with anxiety and depression, grew up with parents saying never talk to anyone   Family History:  Family History  Problem Relation Age of Onset   Hyperlipidemia Mother    Hypertension  Mother    Diabetes Mother    Heart disease Father 16       MI   Hyperthyroidism Sister    Diabetes Maternal Grandmother     Social History:  Social History   Socioeconomic History   Marital status: Married    Spouse name: Not on file   Number of children: Not on file   Years of education: Not on file   Highest education level: Not on file  Occupational History   Not on file  Tobacco Use   Smoking status: Every Day    Packs/day: .1    Types: Cigarettes    Last attempt to quit: 10/24/2020    Years since quitting: 2.1   Smokeless tobacco: Never   Tobacco comments:    1 cigarettes daily   Vaping Use   Vaping Use: Never used  Substance and Sexual Activity   Alcohol use: No   Drug use: No   Sexual activity: Yes    Birth control/protection: Surgical  Other Topics Concern   Not on file  Social History Narrative   Married since 2003.Lives with husband and kids.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   Social Determinants of Health   Financial Resource Strain: Not on file  Food Insecurity: Not on file  Transportation Needs: Not on file  Physical Activity: Not on file  Stress: Not on file  Social Connections: Not on file    Allergies: No Known Allergies  Current Medications: Current Outpatient Medications  Medication Sig Dispense Refill   bisoprolol-hydrochlorothiazide (ZIAC) 5-6.25 MG tablet Take 1 tablet by mouth daily.     DULoxetine 40 MG CPEP Take 1 capsule (40 mg total) by mouth daily. 30 capsule 2   ferrous sulfate 324 MG TBEC Take 324 mg by mouth in the morning and at bedtime.     levothyroxine (SYNTHROID) 75 MCG tablet Take 75 mcg by mouth daily before breakfast.     No current facility-administered medications for this visit.    ROS: Review of Systems  Endocrine: Positive for cold intolerance.  Skin:        Hair loss  Neurological:  Positive for headaches.  Psychiatric/Behavioral:  Positive for  decreased concentration and dysphoric mood. Negative for hallucinations, sleep disturbance and suicidal ideas. The patient is nervous/anxious. The patient is not hyperactive.     Objective:  Psychiatric Specialty Exam: There were no vitals taken for this visit.There is no height or weight on file to calculate BMI.  General Appearance: Casual, Fairly Groomed, and appears stated age  Eye Contact:  Good  Speech:  Clear and Coherent and Normal Rate  Volume:  Normal  Mood:   "About the same"  Affect:  Appropriate, Congruent, Constricted, and anxious less depressed than initial appointment  Thought Content:  Logical and Hallucinations: None   Suicidal Thoughts:   None today but has happened intermittently in the last month  Homicidal Thoughts:  No  Thought Process:  Goal Directed and Linear  Orientation:  Full (Time, Place, and Person)    Memory:  Immediate;   Fair  Judgment:  Fair  Insight:  Fair  Concentration:  Concentration: Good  Recall:  Good  Fund of Knowledge: Fair  Language: Fair  Psychomotor Activity:  Restlessness  Akathisia:  No  AIMS (if indicated): not done  Assets:  Communication Skills Desire for Improvement Financial Resources/Insurance Housing Intimacy Leisure Time Physical Health Resilience Social Support Talents/Skills Transportation Vocational/Educational  ADL's:  Intact  Cognition: WNL  Sleep:  Good   PE: General: sits comfortably in view of camera; no acute distress  Pulm: no increased work of breathing on room air  MSK: all extremity movements appear intact  Neuro: no focal neurological deficits observed  Gait & Station: unable to assess by video    Metabolic Disorder Labs: Lab Results  Component Value Date   HGBA1C 5.6 01/21/2021   MPG 114 01/21/2021   MPG 103 05/07/2016   No results found for: "PROLACTIN" Lab Results  Component Value Date   CHOL 176 01/21/2021   TRIG 144 01/21/2021   HDL 45 (L) 01/21/2021   CHOLHDL 3.9 01/21/2021    LDLCALC 105 (H) 01/21/2021   Lab Results  Component Value Date   TSH 1.37 01/21/2021   TSH 12.219 (H) 11/23/2020    Therapeutic Level Labs: No results found for: "LITHIUM" No results found for: "VALPROATE" No results found for: "CBMZ"  Screenings:  PHQ2-9    Flowsheet Row Office Visit from 11/13/2022 in [facility] [place] Office Visit from 01/21/2021 in [facility]  PHQ-2 Total Score 6 0  PHQ-9 Total Score 14 --      Flowsheet Row Office Visit from 11/13/2022 in [facility] [place] ED from 10/21/2020 in [facility] Emergency Department at [facility]  C-SSRS RISK CATEGORY Moderate Risk No Risk       Collaboration of Care: Collaboration of Care: Medication Management AEB as above, Primary Care Provider AEB as above, and Referral or follow-up with counselor/therapist AEB as above  Patient/Guardian was advised Release of Information must be obtained prior to any record release in order to collaborate their care with an outside provider. Patient/Guardian was advised if they have not already done so to contact the registration department to sign all necessary forms in order for [place] to release information regarding their care.   Consent: Patient/Guardian gives verbal consent for treatment and assignment of benefits for services provided during this visit. Patient/Guardian expressed understanding and agreed to proceed.   Televisit via video: I connected with patient on 12/15/22 at  4:00 PM EDT by a video enabled telemedicine application and verified that I am speaking with the correct person using two identifiers.  Location: Patient: Meagan Reynolds at Home Provider: remote office in [place]   I discussed the limitations of evaluation and management by telemedicine and the availability of in person appointments. The patient expressed understanding and agreed to proceed.  I discussed the assessment and  treatment plan with the patient. The patient was provided an opportunity to ask questions and all were answered. The patient agreed with the plan and demonstrated an understanding of the instructions.   The patient was advised to call back or seek an in-person evaluation if the symptoms worsen or  if the condition fails to improve as anticipated.  I provided 20 minutes of non-face-to-face time during this encounter.  Elsie LincolnSamuel A Chanin Frumkin, MD 12/15/2022, 4:35 PM

## 2022-12-17 IMAGING — CT CT HEAD W/O CM
3 series · 16 of 47 positions shown, 19 images · non-contrast
Comparison: None.

CLINICAL DATA: Right-sided headaches for 1 month.

EXAM:
CT HEAD WITHOUT CONTRAST
TECHNIQUE: Contiguous axial images were obtained from the base of the skull
through the vertex without intravenous contrast.

[Series 2: head w o · axial · 0.47mm/px · z∈[+30,+155]mm · 10 of 31 slices shown, 13 images]
[im 3/31  brain]
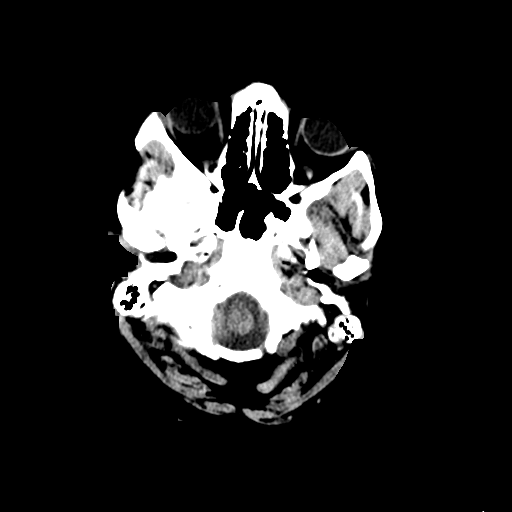
[im 3/31  bone]
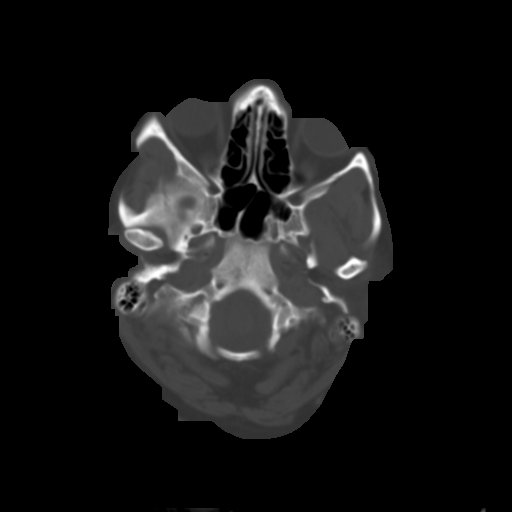
[im 6/31  brain]
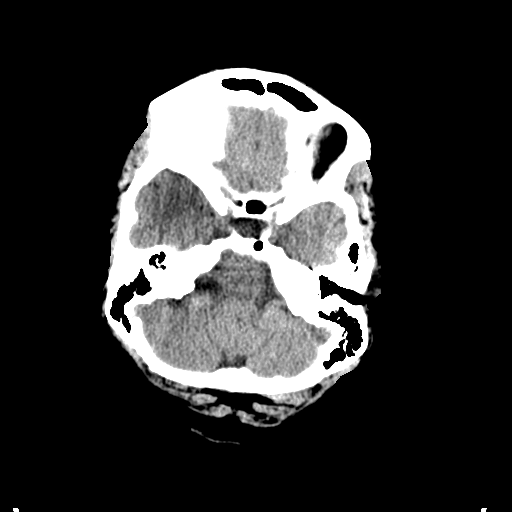
[im 9/31  brain]
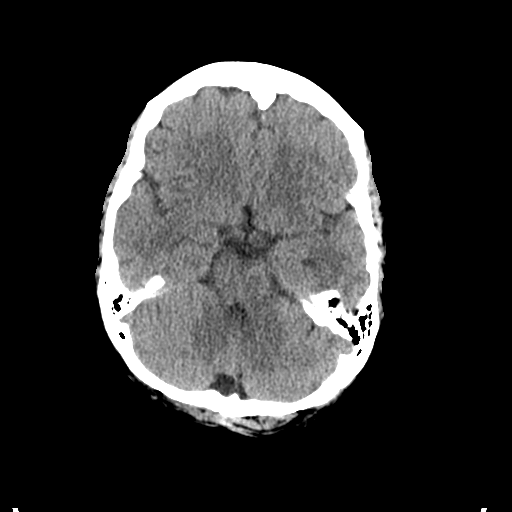
[im 11/31  brain]
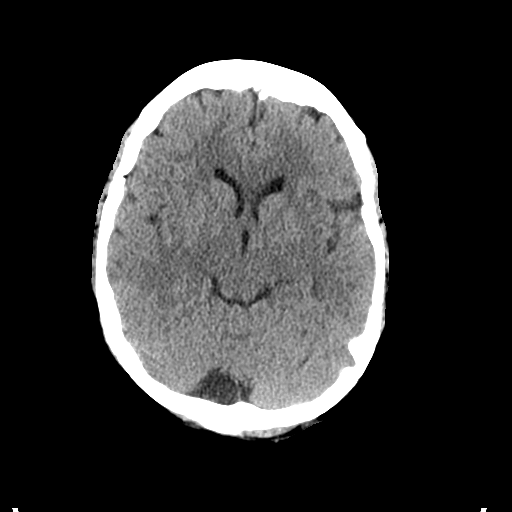
[im 14/31  brain]
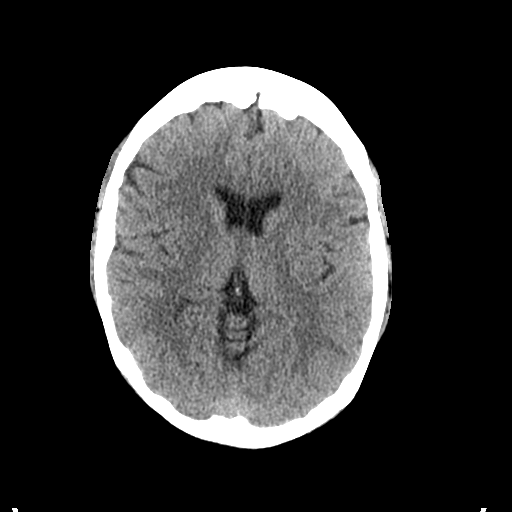
[im 14/31  bone]
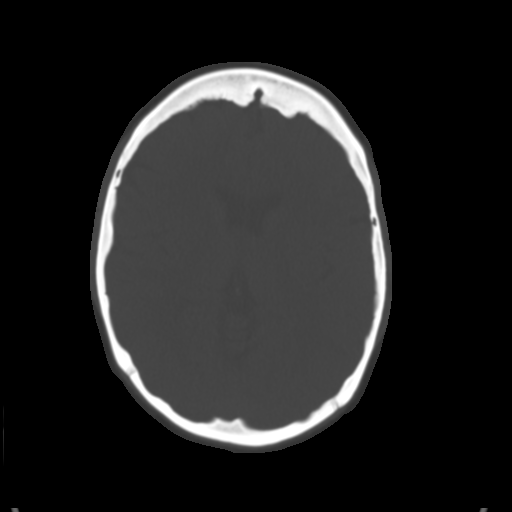
[im 17/31  brain]
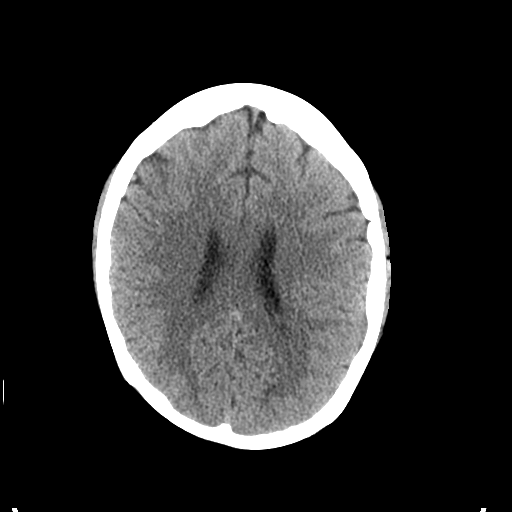
[im 20/31  brain]
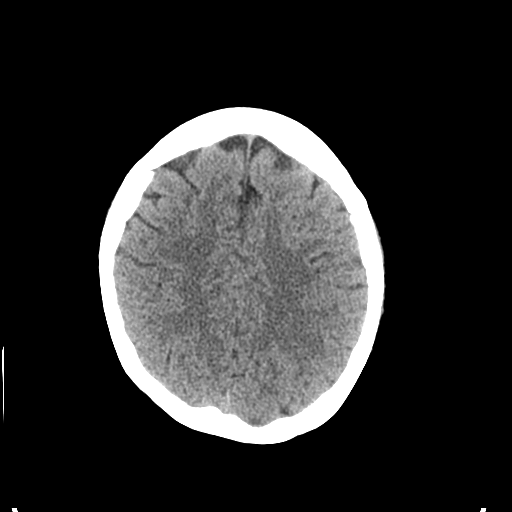
[im 23/31  brain]
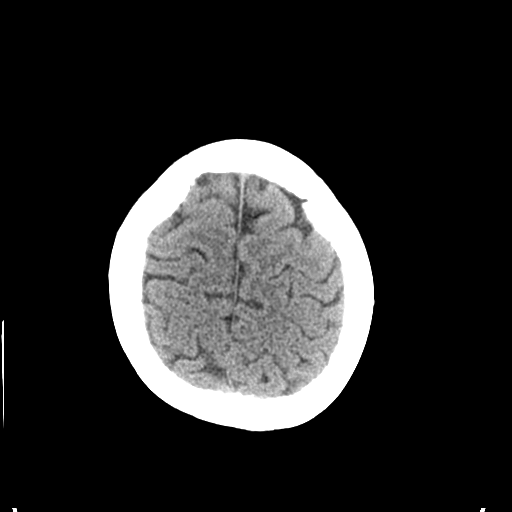
[im 25/31  brain]
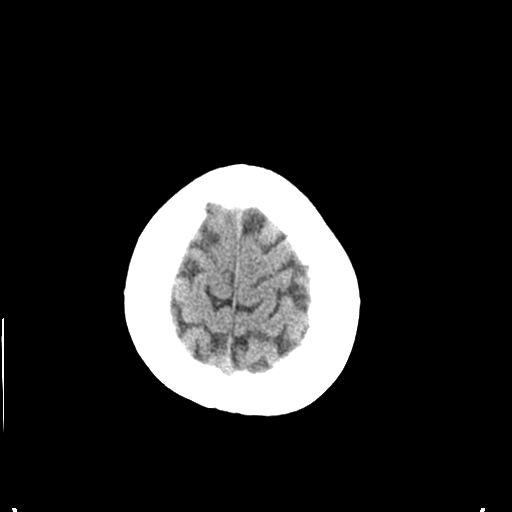
[im 25/31  bone]
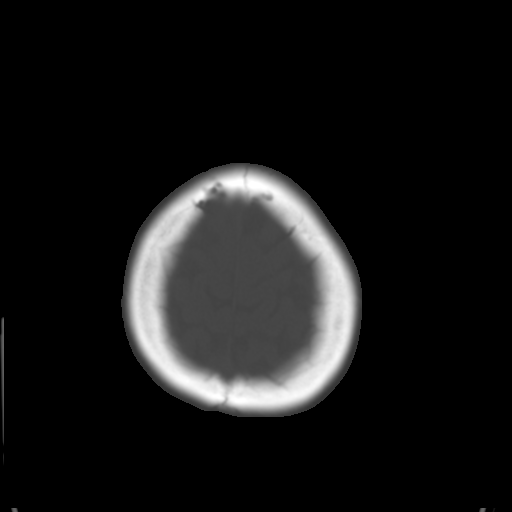
[im 28/31  brain]
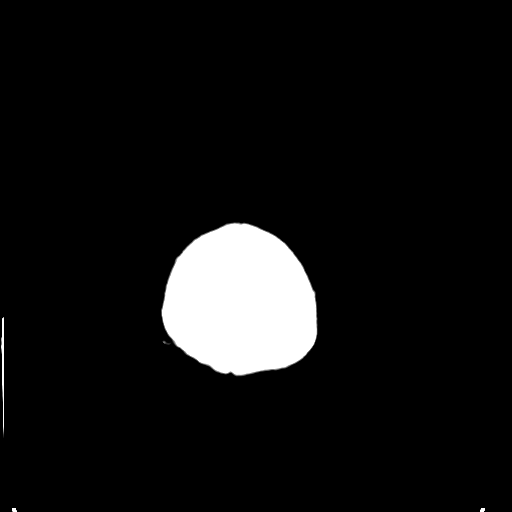

[Series 4: coronal soft · coronal · 0.33mm/px · 3 of 65 slices shown]
[im 22/65  brain]
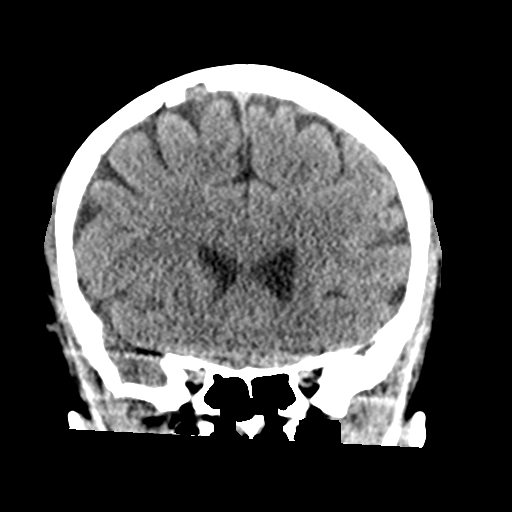
[im 29/65  brain]
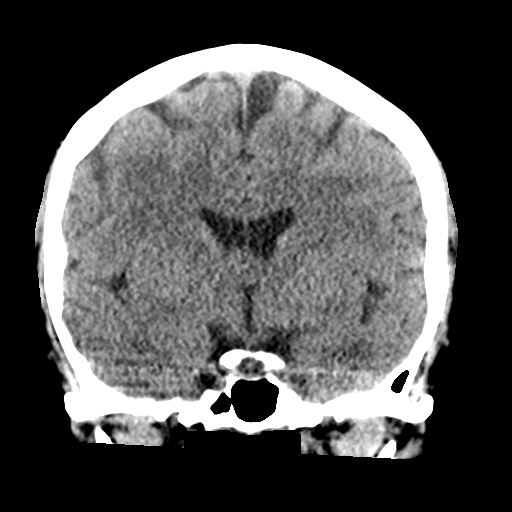
[im 36/65  brain]
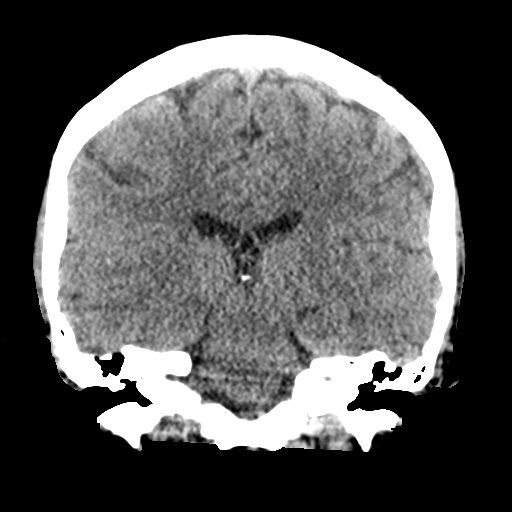

[Series 5: sagittal soft · sagittal · 0.34mm/px · 3 of 57 slices shown]
[im 19/57  brain]
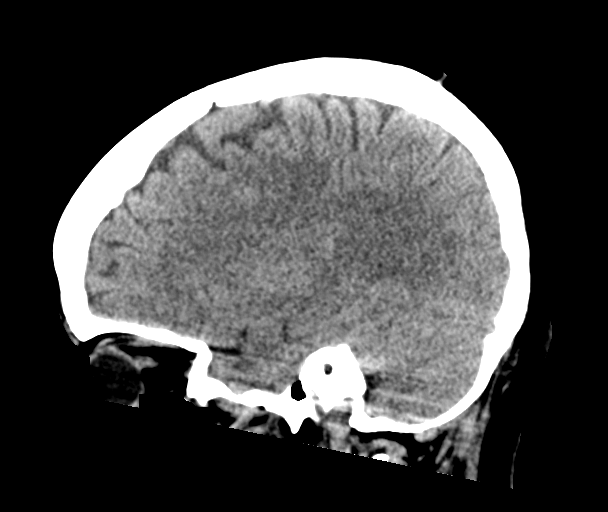
[im 29/57  brain]
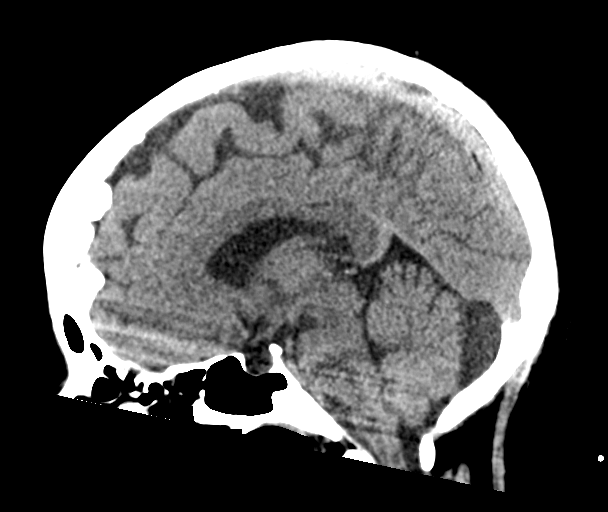
[im 38/57  brain]
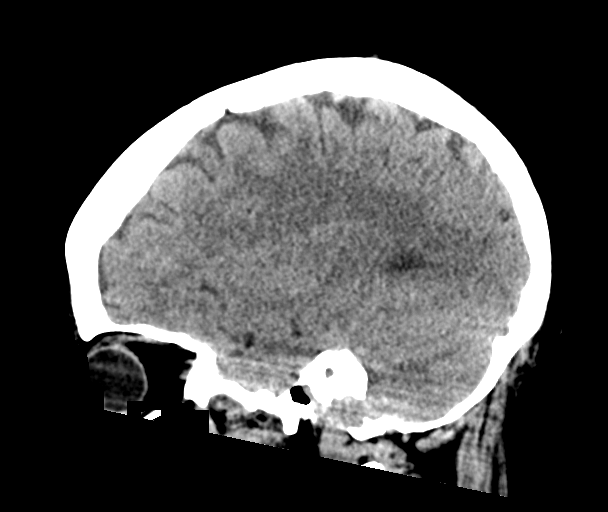

[16 of 47 positions shown; findings below may reference images not displayed]

FINDINGS: Brain: No evidence of acute infarction, hemorrhage, hydrocephalus,
extra-axial collection or mass lesion/mass effect.

Vascular: No hyperdense vessel or unexpected calcification.

Skull: Normal. Negative for fracture or focal lesion.

Sinuses/Orbits: Visualized globes and orbits are unremarkable.
Visualized sinuses are clear.

Other: None.
IMPRESSION: Normal enhanced CT scan of the brain.

## 2023-01-06 DIAGNOSIS — R7303 Prediabetes: Secondary | ICD-10-CM | POA: Diagnosis not present

## 2023-01-06 DIAGNOSIS — I10 Essential (primary) hypertension: Secondary | ICD-10-CM | POA: Diagnosis not present

## 2023-01-06 DIAGNOSIS — D509 Iron deficiency anemia, unspecified: Secondary | ICD-10-CM | POA: Diagnosis not present

## 2023-01-06 DIAGNOSIS — E039 Hypothyroidism, unspecified: Secondary | ICD-10-CM | POA: Diagnosis not present

## 2023-01-14 DIAGNOSIS — Z79890 Hormone replacement therapy (postmenopausal): Secondary | ICD-10-CM | POA: Diagnosis not present

## 2023-01-14 DIAGNOSIS — G43009 Migraine without aura, not intractable, without status migrainosus: Secondary | ICD-10-CM | POA: Diagnosis not present

## 2023-01-14 DIAGNOSIS — E039 Hypothyroidism, unspecified: Secondary | ICD-10-CM | POA: Diagnosis not present

## 2023-01-14 DIAGNOSIS — E785 Hyperlipidemia, unspecified: Secondary | ICD-10-CM | POA: Diagnosis not present

## 2023-01-14 DIAGNOSIS — F17210 Nicotine dependence, cigarettes, uncomplicated: Secondary | ICD-10-CM | POA: Diagnosis not present

## 2023-01-14 DIAGNOSIS — E1165 Type 2 diabetes mellitus with hyperglycemia: Secondary | ICD-10-CM | POA: Diagnosis not present

## 2023-01-14 DIAGNOSIS — F411 Generalized anxiety disorder: Secondary | ICD-10-CM | POA: Diagnosis not present

## 2023-01-14 DIAGNOSIS — Z7984 Long term (current) use of oral hypoglycemic drugs: Secondary | ICD-10-CM | POA: Diagnosis not present

## 2023-01-14 DIAGNOSIS — D509 Iron deficiency anemia, unspecified: Secondary | ICD-10-CM | POA: Diagnosis not present

## 2023-01-14 DIAGNOSIS — L719 Rosacea, unspecified: Secondary | ICD-10-CM | POA: Diagnosis not present

## 2023-01-14 DIAGNOSIS — H9212 Otorrhea, left ear: Secondary | ICD-10-CM | POA: Diagnosis not present

## 2023-01-14 DIAGNOSIS — I10 Essential (primary) hypertension: Secondary | ICD-10-CM | POA: Diagnosis not present

## 2023-01-15 ENCOUNTER — Encounter (INDEPENDENT_AMBULATORY_CARE_PROVIDER_SITE_OTHER): Payer: Self-pay | Admitting: *Deleted

## 2023-01-15 ENCOUNTER — Telehealth (INDEPENDENT_AMBULATORY_CARE_PROVIDER_SITE_OTHER): Payer: 59 | Admitting: Psychiatry

## 2023-01-15 ENCOUNTER — Telehealth (HOSPITAL_COMMUNITY): Payer: 59 | Admitting: Psychiatry

## 2023-01-15 ENCOUNTER — Encounter (HOSPITAL_COMMUNITY): Payer: Self-pay | Admitting: Psychiatry

## 2023-01-15 DIAGNOSIS — F410 Panic disorder [episodic paroxysmal anxiety] without agoraphobia: Secondary | ICD-10-CM | POA: Diagnosis not present

## 2023-01-15 DIAGNOSIS — F332 Major depressive disorder, recurrent severe without psychotic features: Secondary | ICD-10-CM

## 2023-01-15 DIAGNOSIS — F411 Generalized anxiety disorder: Secondary | ICD-10-CM

## 2023-01-15 DIAGNOSIS — F4310 Post-traumatic stress disorder, unspecified: Secondary | ICD-10-CM

## 2023-01-15 DIAGNOSIS — F4010 Social phobia, unspecified: Secondary | ICD-10-CM

## 2023-01-15 LAB — LAB REPORT - SCANNED
A1c: 6.5
Albumin, Urine POC: 7.7
Albumin/Creatinine Ratio, Urine, POC: 8
Creatinine, POC: 101.1 mg/dL
EGFR: 115

## 2023-01-15 MED ORDER — DULOXETINE HCL 60 MG PO CPEP
60.0000 mg | ORAL_CAPSULE | Freq: Every day | ORAL | 2 refills | Status: DC
Start: 2023-01-15 — End: 2023-05-18

## 2023-01-15 NOTE — Progress Notes (Signed)
BH MD Outpatient Progress Note  01/15/2023 2:16 PM Meagan Reynolds  MRN:  [id number]  Assessment:  Meagan Reynolds presents for follow-up evaluation. Today, 01/15/23, patient reports significant improvement to her depression with having only 1 episode of suicidal ideation in the last month with titration of Cymbalta.  While she is still reporting anxiety this has improved and she is beginning to think of speaking more when in public but being in public and crowds are her primary stressor.    Physically is tolerating the Cymbalta well and we will titrate today as outlined in plan below.  She was able to make positive change with caffeine use down to 1 soda per day after she went to see her PCP again and found to have prediabetes.  She smokes 1 cigarette/day and is precontemplative with regard to change.  With her social anxiety is still hesitant to engage in psychotherapy because it would entail having to open up to a new person.  Follow-up in 2 months due to clinical improvement.   For safety, her acute risk factors for suicide are: Current diagnosis of depression, intermittent SI.  Her chronic risk factors for suicide are: Prior death of child, past suicide attempts by overdose, chronic mental illness.  Her protective factors are: Beloved pets living in the home, minor children living in the home, supportive family, employment, actively seeking and engaging with mental health care, contracting for safety, lack of intent with suicidal ideation, no suicidal ideation present today, hope for the future.  While future events cannot be fully predicted, she does not currently meet IVC criteria and can be continued as an outpatient.  Identifying Information: Meagan Reynolds (pronounced [name]) is a 47 y.o. female with a history of PTSD from the death of her child after being delivered in 2002/02/19, major depressive disorder with 2 lifetime suicide attempts, generalized anxiety disorder with panic attacks, social anxiety  disorder, hypothyroidism, vitamin D deficiency, iron deficiency anemia who is an established patient with [facility] participating in follow-up via video conferencing. Initial evaluation of depression and anxiety on 11/13/22; please see that note for full case discussion.  She denied any trauma history but struggles in crowds and being around people.  Some paranoia that someone will try to break into her home though this has never happened before.  She had several medication trials but all were ineffective including fluoxetine, sertraline, buspirone, venlafaxine.  There was one medication that she was told she could become addicted to but cannot over the name of this medication and unclear if it was Topamax or not.  There was some worsening of her mood and the lead up to her menses.  But she reported her mood is generally poor for most of the month as well.  She was amenable to trial of Cymbalta as next SNRI agent.  There are several physical reasons for her poor mood as well as her vitamin D level was low but unclear supplementation after finding this and has an iron deficiency anemia but her B12 and folate had not been checked in some time.  Additionally her previous thyroid medication provider left the practice and she was still getting levels sorted out.   Her depression did worsen when she thought of delivering her son who died in 02/19/04 after being born and she does qualify for PTSD from this event.  While she had 2 suicide attempts, the first being after this death of her child, she was contracting for safety and had no intent  when the thoughts did come up.      Plan:   # PTSD Past medication trials: See med trials below Status of problem: Improving Interventions: -- Titrate Cymbalta to 60 mg once daily (S3/7/24, i4/8/24, i5/8/24) --Patient to call insurer to find CBT provider that is in network --Consider prazosin in the future   # Major depressive disorder, recurrent, severe  without psychotic features with intermittent SI with plan without intent  2 lifetime suicide attempts by overdose Past medication trials:  Status of problem: Improving Interventions: -- Cymbalta, CBT as above   # Generalized anxiety disorder with panic attacks  social anxiety disorder  Past medication trials:  Status of problem: Improving Interventions: -- Cymbalta, CBT as above   # Vitamin D deficiency with hair loss  iron deficiency anemia  hypothyroidism Past medication trials:  Status of problem: Chronic and stable Interventions: -- Continue levothyroxine per PCP -- Awaiting results from PCP blood check recently   # Tobacco use disorder Past medication trials:  Status of problem: Chronic and stable Interventions: -- Tobacco cessation counseling provided  Patient was given contact information for behavioral health clinic and was instructed to call 911 for emergencies.   Subjective:  Chief Complaint:  Chief Complaint  Patient presents with   Anxiety   Depression   Follow-up   Stress    Interval History: Things have been good, much better. Is thinking the medication is finally helping her. Only had one instance of SI in the last month. Still sleeping well. Anxiety is still present but not as bad as before. Crowds are still the main source of anxiety or having to pay bills. Has been able to cut down on caffeine to one per day, son told her to quit because she was finally able to get the bloodwork at her PCP and found that she was pre-diabetic. Still smoking 1 cigarette per night.   Visit Diagnosis:    ICD-10-CM   1. Generalized anxiety disorder with panic attacks  F41.1 DULoxetine (CYMBALTA) 60 MG capsule   F41.0     2. Social anxiety disorder  F40.10 DULoxetine (CYMBALTA) 60 MG capsule    3. PTSD (post-traumatic stress disorder)  F43.10 DULoxetine (CYMBALTA) 60 MG capsule    4. Major depressive disorder, recurrent severe without psychotic features (HCC)  F33.2  DULoxetine (CYMBALTA) 60 MG capsule      Past Psychiatric History:  Diagnoses: PTSD from the death of her child after being delivered in 2005, major depressive disorder with 2 lifetime suicide attempts, generalized anxiety disorder with panic attacks, social anxiety disorder Medication trials: fluoxetine (ineffective), zoloft (ineffective), sertraline (ineffective), buspirone (ineffective), venlafaxine (ineffective but never higher than 37.5mg ), topamax (ineffective) Previous psychiatrist/therapist: yes but insurance didn't cover so only saw once Hospitalizations: none Suicide attempts: overdosed as a teenager and overdosed when her baby died; not hospitalized for either because didn't seek help. SIB: none Hx of violence towards others: none Current access to guns: none Hx of abuse: none Substance use: none  Past Medical History:  Past Medical History:  Diagnosis Date   Agoraphobia with panic disorder 05/07/2016   Anemia    Anxiety    Caffeine overuse 11/13/2022   Depression    Diabetes mellitus without complication (HCC)    gestational   Gestational diabetes 05/07/2016   Hypertension    Thyroid disease     Past Surgical History:  Procedure Laterality Date   CHOLECYSTECTOMY  2004   TUBAL LIGATION      Family Psychiatric History:  mother with depression, two sisters with anxiety and depression, grew up with parents saying never talk to anyone   Family History:  Family History  Problem Relation Age of Onset   Hyperlipidemia Mother    Hypertension Mother    Diabetes Mother    Heart disease Father 66       MI   Hyperthyroidism Sister    Diabetes Maternal Grandmother     Social History:  Social History   Socioeconomic History   Marital status: Married    Spouse name: Not on file   Number of children: Not on file   Years of education: Not on file   Highest education level: Not on file  Occupational History   Not on file  Tobacco Use   Smoking status: Every Day     Packs/day: .1    Types: Cigarettes    Last attempt to quit: 10/24/2020    Years since quitting: 2.2   Smokeless tobacco: Never   Tobacco comments:    1 cigarettes daily   Vaping Use   Vaping Use: Never used  Substance and Sexual Activity   Alcohol use: No   Drug use: No   Sexual activity: Yes    Birth control/protection: Surgical  Other Topics Concern   Not on file  Social History Narrative   Married since 2003.Lives with husband and kids.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   Social Determinants of Health   Financial Resource Strain: Not on file  Food Insecurity: Not on file  Transportation Needs: Not on file  Physical Activity: Not on file  Stress: Not on file  Social Connections: Not on file    Allergies: No Known Allergies  Current Medications: Current Outpatient Medications  Medication Sig Dispense Refill   atorvastatin (LIPITOR) 10 MG tablet Take 10 mg by mouth daily.     Azelaic Acid 15 % gel Apply topically 2 (two) times daily.     DULoxetine (CYMBALTA) 60 MG capsule Take 1 capsule (60 mg total) by mouth daily. 30 capsule 2   levothyroxine (SYNTHROID) 100 MCG tablet Take 100 mcg by mouth daily before breakfast.     metFORMIN (GLUCOPHAGE-XR) 500 MG 24 hr tablet Take 500 mg by mouth daily with breakfast.     bisoprolol-hydrochlorothiazide (ZIAC) 5-6.25 MG tablet Take 1 tablet by mouth daily.     ferrous sulfate 324 MG TBEC Take 324 mg by mouth in the morning and at bedtime.     No current facility-administered medications for this visit.    ROS: Review of Systems  Endocrine: Positive for cold intolerance.  Skin:        Hair loss  Neurological:  Positive for headaches.  Psychiatric/Behavioral:  Positive for decreased concentration and dysphoric mood. Negative for hallucinations, sleep disturbance and suicidal ideas. The patient is nervous/anxious. The patient is not hyperactive.      Objective:  Psychiatric Specialty Exam: There were no vitals taken for this visit.There is no height or weight on file to calculate BMI.  General Appearance: Casual, Fairly Groomed, and appears stated age  Eye Contact:  Good  Speech:  Clear and Coherent and Normal Rate  Volume:  Normal  Mood:   "I am feeling better!  Though still can be anxious"  Affect:  Appropriate, Congruent, Constricted, and while still anxious less so than initial appointment and significantly less depressed.  Spontaneous smile  Thought Content: Logical and Hallucinations: None   Suicidal Thoughts:  None today but down to 1 time in the last month  Homicidal Thoughts:  No  Thought Process:  Goal Directed and Linear  Orientation:  Full (Time, Place, and Person)    Memory:  Immediate;   Fair  Judgment:  Fair  Insight:  Fair  Concentration:  Concentration: Good  Recall:  Good  Fund of Knowledge: Fair  Language: Fair  Psychomotor Activity:  Normal  Akathisia:  No  AIMS (if indicated): not done  Assets:  Communication Skills Desire for Improvement Financial Resources/Insurance Housing Intimacy Leisure Time Physical Health Resilience Social Support Talents/Skills Transportation Vocational/Educational  ADL's:  Intact  Cognition: WNL  Sleep:  Good   PE: General: sits comfortably in view of camera; no acute distress  Pulm: no increased work of breathing on room air  MSK: all extremity movements appear intact  Neuro: no focal neurological deficits observed  Gait & Station: unable to assess by video    Metabolic Disorder Labs: Lab Results  Component Value Date   HGBA1C 5.6 01/21/2021   MPG 114 01/21/2021   MPG 103 05/07/2016   No results found for: "PROLACTIN" Lab Results  Component Value Date   CHOL 176 01/21/2021   TRIG 144 01/21/2021   HDL 45 (L) 01/21/2021   CHOLHDL 3.9 01/21/2021   LDLCALC 105 (H) 01/21/2021   Lab Results  Component Value Date   TSH 1.37 01/21/2021   TSH 12.219  (H) 11/23/2020    Therapeutic Level Labs: No results found for: "LITHIUM" No results found for: "VALPROATE" No results found for: "CBMZ"  Screenings:  PHQ2-9    Flowsheet Row Office Visit from 11/13/2022 in [facility] [place] Office Visit from 01/21/2021 in [facility]  PHQ-2 Total Score 6 0  PHQ-9 Total Score 14 --      Flowsheet Row Office Visit from 11/13/2022 in [facility] [place] ED from 10/21/2020 in [facility]  C-SSRS RISK CATEGORY Moderate Risk No Risk       Collaboration of Care: Collaboration of Care: Medication Management AEB as above, Primary Care Provider AEB as above, and Referral or follow-up with counselor/therapist AEB as above  Patient/Guardian was advised Release of Information must be obtained prior to any record release in order to collaborate their care with an outside provider. Patient/Guardian was advised if they have not already done so to contact the registration department to sign all necessary forms in order for [place] to release information regarding their care.   Consent: Patient/Guardian gives verbal consent for treatment and assignment of benefits for services provided during this visit. Patient/Guardian expressed understanding and agreed to proceed.   Televisit via video: I connected with patient on 01/15/23 at  2:00 PM EDT by a video enabled telemedicine application and verified that I am speaking with the correct person using two identifiers.  Location: Patient: [place] Provider: remote office in [place]   I discussed the limitations of evaluation and management by telemedicine and the availability of in person appointments. The patient expressed understanding and agreed to proceed.  I discussed the assessment and treatment plan with the patient. The patient was provided an opportunity to ask questions and all were  answered. The patient agreed with the plan and demonstrated an understanding of the instructions.   The patient was advised to call back or seek an in-person evaluation if the symptoms worsen or if the condition fails to improve as anticipated.  I provided 20 minutes of non-face-to-face time during this encounter.  Elsie Lincoln, MD 01/15/2023, 2:16 PM

## 2023-01-15 NOTE — Patient Instructions (Signed)
We increased the Cymbalta to 60 mg once daily today.  This should help hopefully get to resolution of the suicidal ideation and improve your anxiety further.  If this is still lingering please do consider calling her insurer to find a CBT provider that is in network.  Keep up the good work with cutting back on caffeine and try your best to cut back on cigarettes.

## 2023-02-06 IMAGING — US US THYROID
1 series · 13 of 25 positions shown · non-contrast
Comparison: None.

CLINICAL DATA: Thyroiditis for 2 months

EXAM:
THYROID ULTRASOUND
TECHNIQUE: Ultrasound examination of the thyroid gland and adjacent soft
tissues was performed.

[Series 1: us thyroid · 47 acquisitions, 13 frames shown]
[im 1/47]
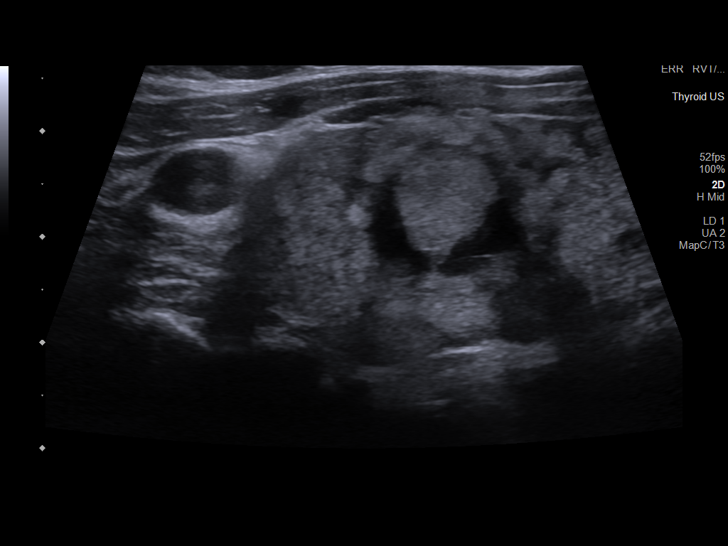
[im 4/47]
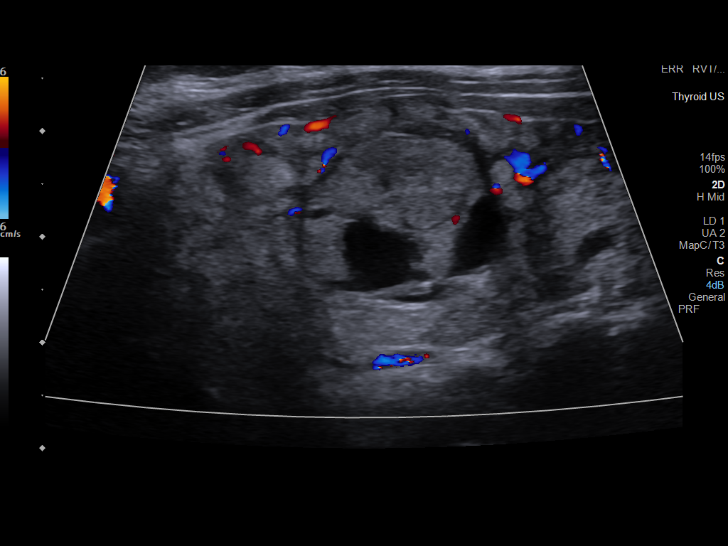
[im 8/47]
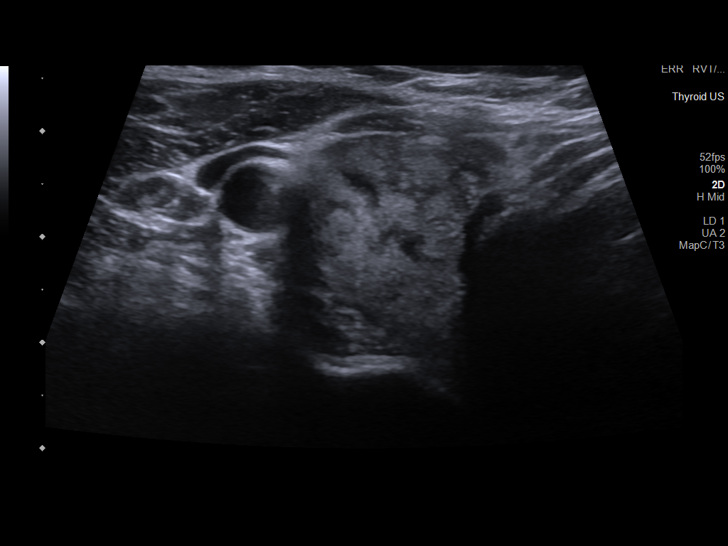
[im 12/47]
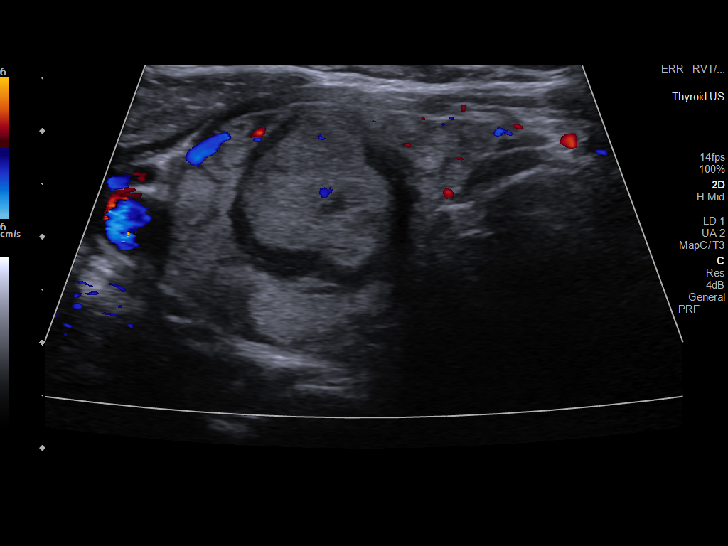
[im 16/47]
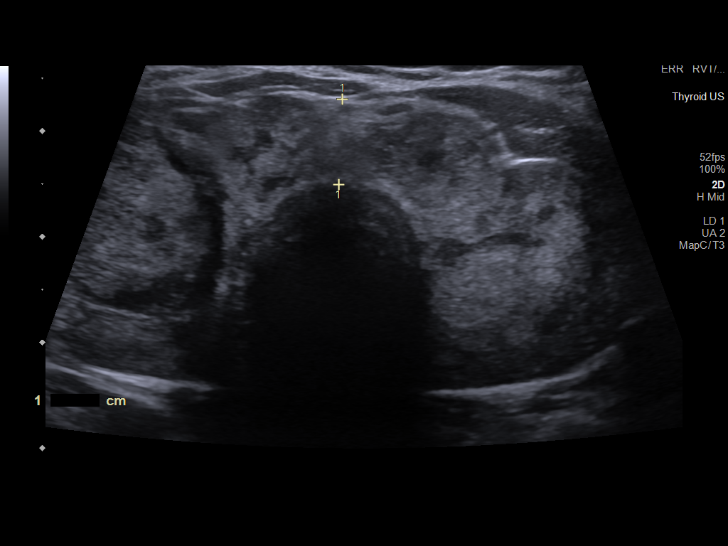
[im 20/47]
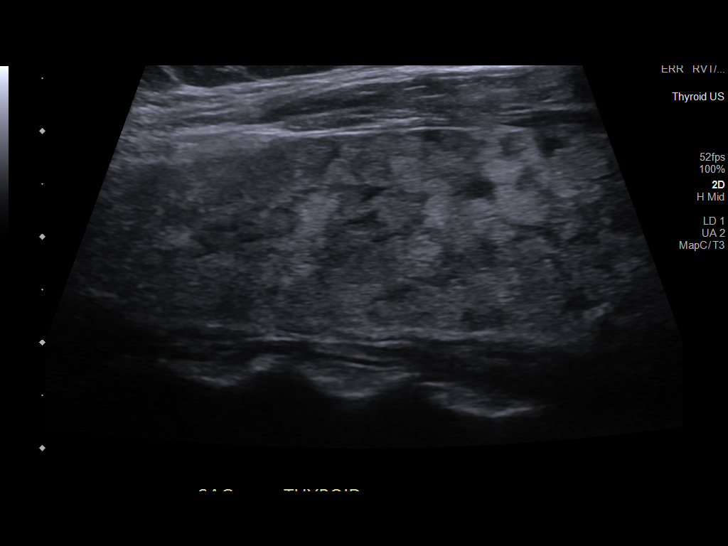
[im 24/47]
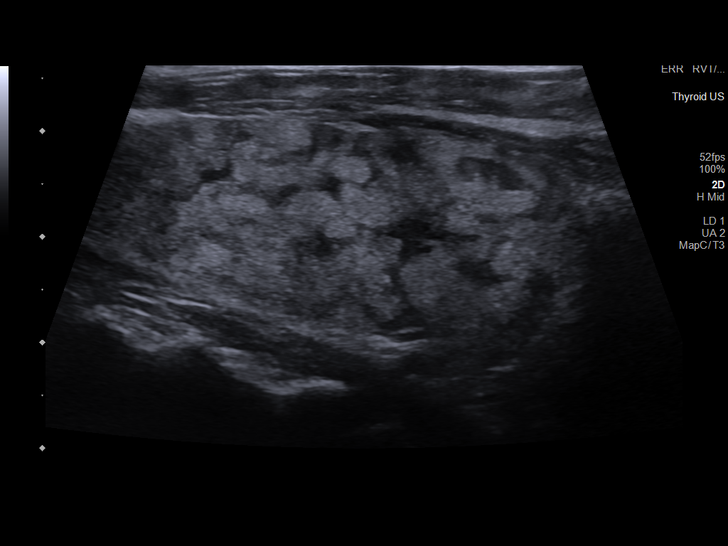
[im 27/47]
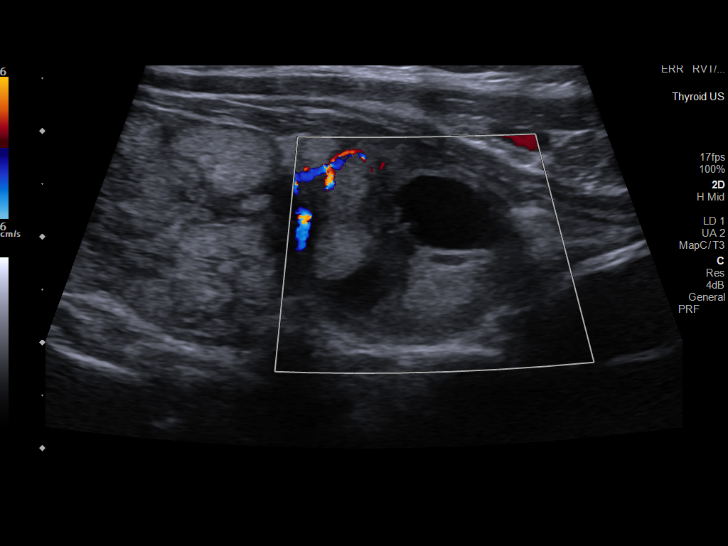
[im 31/47]
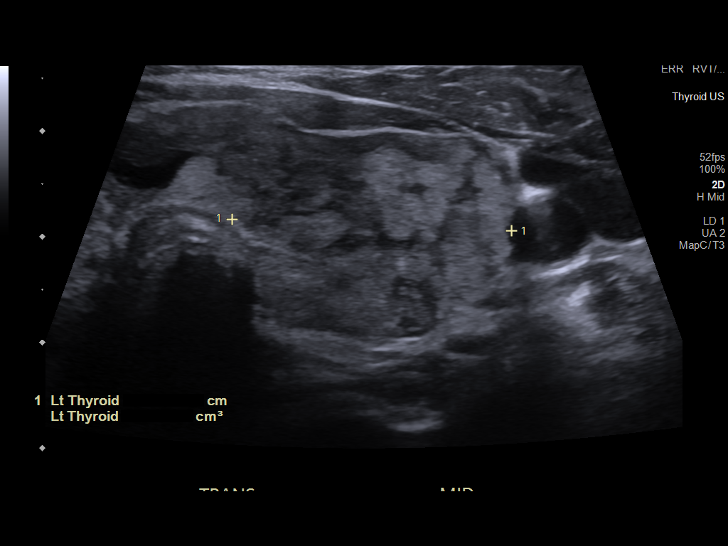
[im 35/47]
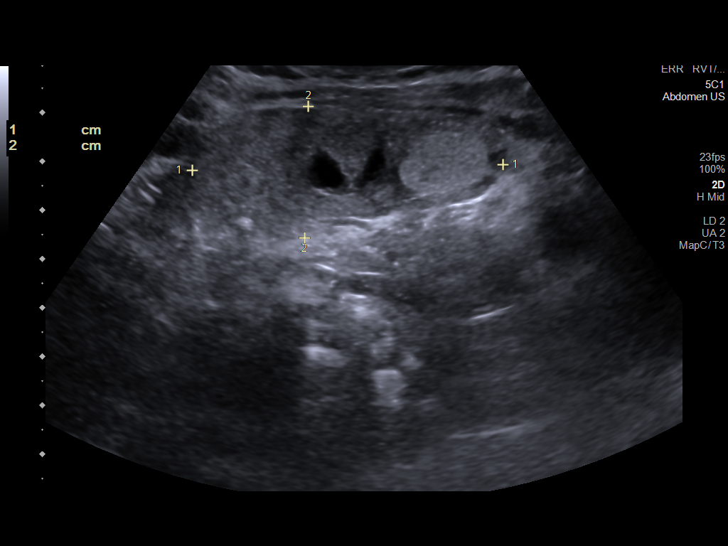
[im 39/47]
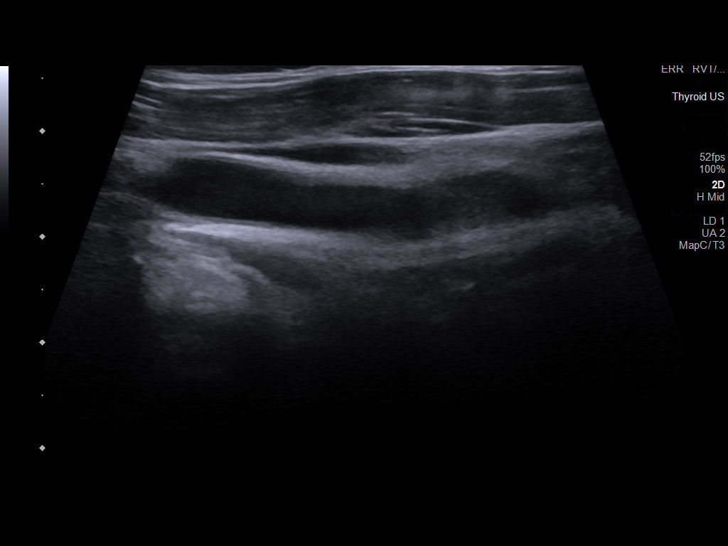
[im 43/47]
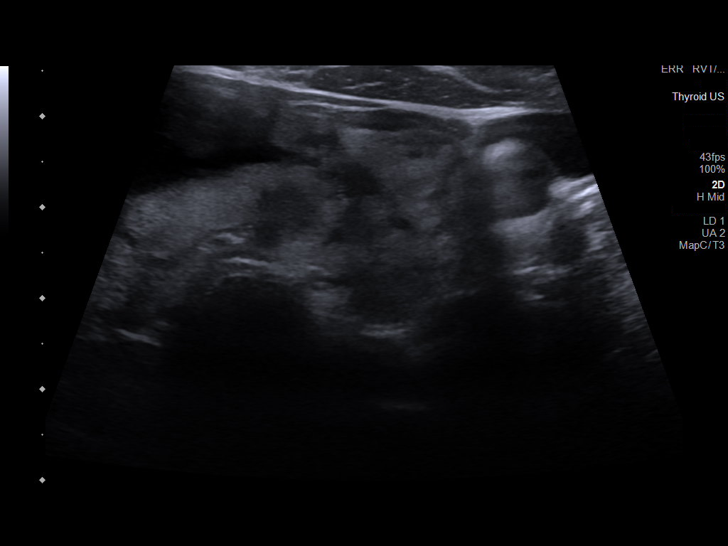
[im 47/47]
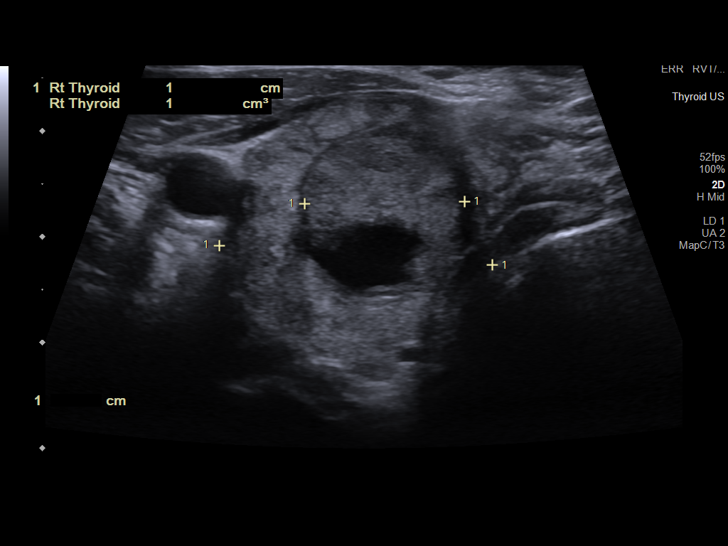

[13 of 25 positions shown; findings below may reference images not displayed]

FINDINGS: Parenchymal Echotexture: Moderately heterogeneous

Isthmus: 0.8 cm

Right lobe: 6.0 x 2.8 x 2.5 cm

Left lobe: 5.9 x 2.3 x 2.7 cm

_________________________________________________________

Estimated total number of nodules >/= 1 cm: 3

Number of spongiform nodules >/=  2 cm not described below (TR1): 0

Number of mixed cystic and solid nodules >/= 1.5 cm not described
below (TR2): 0

_________________________________________________________

Nodule # 1:

Location: Isthmus; mid

Maximum size: 2.5 cm; Other 2 dimensions: 1.6 x 1.5 cm

Composition: mixed cystic and solid (1)

Echogenicity: isoechoic (1)

Shape: not taller-than-wide (0)

Margins: smooth (0)

Echogenic foci: none (0)

ACR TI-RADS total points: 2.

ACR TI-RADS risk category: TR2 (2 points).

ACR TI-RADS recommendations:

This nodule does NOT meet TI-RADS criteria for biopsy or dedicated
follow-up.

_________________________________________________________

Nodule # 2:

Location: Right; mid

Maximum size: 1.8 cm; Other 2 dimensions: 1.7 x 1.5 cm

Composition: solid/almost completely solid (2)

Echogenicity: isoechoic (1)

Shape: not taller-than-wide (0)

Margins: smooth (0)

Echogenic foci: none (0)

ACR TI-RADS total points: 3.

ACR TI-RADS risk category: TR3 (3 points).

ACR TI-RADS recommendations:

*Given size (>/= 1.5 - 2.4 cm) and appearance, a follow-up
ultrasound in 1 year should be considered based on TI-RADS criteria.

_________________________________________________________

Nodule # 3:

Location: Left; inferior

Maximum size: 2.4 cm; Other 2 dimensions: 1.7 x 1.7 cm

Composition: solid/almost completely solid (2)

Echogenicity: isoechoic (1)

Shape: not taller-than-wide (0)

Margins: smooth (0)

Echogenic foci: none (0)

ACR TI-RADS total points: 3.

ACR TI-RADS risk category: TR3 (3 points).

ACR TI-RADS recommendations:

*Given size (>/= 1.5 - 2.4 cm) and appearance, a follow-up
ultrasound in 1 year should be considered based on TI-RADS criteria.

_________________________________________________________
IMPRESSION: Nodules 2 and 3 meet criteria for imaging follow-up. Next ultrasound
should be performed in 1 year.

The above is in keeping with the ACR TI-RADS recommendations - [HOSPITAL] 6717;[DATE].

## 2023-03-17 ENCOUNTER — Telehealth (HOSPITAL_COMMUNITY): Payer: 59 | Admitting: Psychiatry

## 2023-04-23 DIAGNOSIS — E1165 Type 2 diabetes mellitus with hyperglycemia: Secondary | ICD-10-CM | POA: Diagnosis not present

## 2023-04-23 DIAGNOSIS — D509 Iron deficiency anemia, unspecified: Secondary | ICD-10-CM | POA: Diagnosis not present

## 2023-04-23 DIAGNOSIS — E039 Hypothyroidism, unspecified: Secondary | ICD-10-CM | POA: Diagnosis not present

## 2023-04-23 DIAGNOSIS — I10 Essential (primary) hypertension: Secondary | ICD-10-CM | POA: Diagnosis not present

## 2023-04-28 ENCOUNTER — Other Ambulatory Visit (HOSPITAL_COMMUNITY): Payer: Self-pay | Admitting: Family Medicine

## 2023-04-28 DIAGNOSIS — H9212 Otorrhea, left ear: Secondary | ICD-10-CM | POA: Diagnosis not present

## 2023-04-28 DIAGNOSIS — L719 Rosacea, unspecified: Secondary | ICD-10-CM | POA: Diagnosis not present

## 2023-04-28 DIAGNOSIS — L989 Disorder of the skin and subcutaneous tissue, unspecified: Secondary | ICD-10-CM | POA: Diagnosis not present

## 2023-04-28 DIAGNOSIS — D509 Iron deficiency anemia, unspecified: Secondary | ICD-10-CM | POA: Diagnosis not present

## 2023-04-28 DIAGNOSIS — E039 Hypothyroidism, unspecified: Secondary | ICD-10-CM | POA: Diagnosis not present

## 2023-04-28 DIAGNOSIS — R251 Tremor, unspecified: Secondary | ICD-10-CM | POA: Diagnosis not present

## 2023-04-28 DIAGNOSIS — G43009 Migraine without aura, not intractable, without status migrainosus: Secondary | ICD-10-CM | POA: Diagnosis not present

## 2023-04-28 DIAGNOSIS — I10 Essential (primary) hypertension: Secondary | ICD-10-CM | POA: Diagnosis not present

## 2023-04-28 DIAGNOSIS — E785 Hyperlipidemia, unspecified: Secondary | ICD-10-CM | POA: Diagnosis not present

## 2023-04-28 DIAGNOSIS — R400 Somnolence: Secondary | ICD-10-CM | POA: Diagnosis not present

## 2023-04-28 DIAGNOSIS — F411 Generalized anxiety disorder: Secondary | ICD-10-CM | POA: Diagnosis not present

## 2023-04-28 DIAGNOSIS — E1165 Type 2 diabetes mellitus with hyperglycemia: Secondary | ICD-10-CM | POA: Diagnosis not present

## 2023-05-06 DIAGNOSIS — G4730 Sleep apnea, unspecified: Secondary | ICD-10-CM | POA: Diagnosis not present

## 2023-05-07 ENCOUNTER — Ambulatory Visit (HOSPITAL_COMMUNITY)
Admission: RE | Admit: 2023-05-07 | Discharge: 2023-05-07 | Disposition: A | Discharge status: Home / Self Care | Payer: 59 | Source: Ambulatory Visit | Attending: Family Medicine | Admitting: Family Medicine

## 2023-05-07 DIAGNOSIS — R2231 Localized swelling, mass and lump, right upper limb: Secondary | ICD-10-CM | POA: Diagnosis not present

## 2023-05-07 DIAGNOSIS — L989 Disorder of the skin and subcutaneous tissue, unspecified: Secondary | ICD-10-CM | POA: Diagnosis not present

## 2023-05-13 ENCOUNTER — Other Ambulatory Visit (HOSPITAL_COMMUNITY): Payer: Self-pay | Admitting: Psychiatry

## 2023-05-13 DIAGNOSIS — F410 Panic disorder [episodic paroxysmal anxiety] without agoraphobia: Secondary | ICD-10-CM

## 2023-05-13 DIAGNOSIS — F332 Major depressive disorder, recurrent severe without psychotic features: Secondary | ICD-10-CM

## 2023-05-13 DIAGNOSIS — F4310 Post-traumatic stress disorder, unspecified: Secondary | ICD-10-CM

## 2023-05-13 DIAGNOSIS — F4010 Social phobia, unspecified: Secondary | ICD-10-CM

## 2023-05-15 ENCOUNTER — Encounter (HOSPITAL_COMMUNITY): Payer: Self-pay

## 2023-05-18 ENCOUNTER — Encounter (HOSPITAL_COMMUNITY): Payer: Self-pay | Admitting: Psychiatry

## 2023-05-18 ENCOUNTER — Telehealth (INDEPENDENT_AMBULATORY_CARE_PROVIDER_SITE_OTHER): Payer: 59 | Admitting: Psychiatry

## 2023-05-18 DIAGNOSIS — F17210 Nicotine dependence, cigarettes, uncomplicated: Secondary | ICD-10-CM

## 2023-05-18 DIAGNOSIS — F4010 Social phobia, unspecified: Secondary | ICD-10-CM | POA: Diagnosis not present

## 2023-05-18 DIAGNOSIS — F4310 Post-traumatic stress disorder, unspecified: Secondary | ICD-10-CM | POA: Diagnosis not present

## 2023-05-18 DIAGNOSIS — F410 Panic disorder [episodic paroxysmal anxiety] without agoraphobia: Secondary | ICD-10-CM

## 2023-05-18 DIAGNOSIS — F3341 Major depressive disorder, recurrent, in partial remission: Secondary | ICD-10-CM

## 2023-05-18 DIAGNOSIS — F411 Generalized anxiety disorder: Secondary | ICD-10-CM | POA: Diagnosis not present

## 2023-05-18 DIAGNOSIS — Z9151 Personal history of suicidal behavior: Secondary | ICD-10-CM

## 2023-05-18 MED ORDER — DULOXETINE HCL 60 MG PO CPEP
60.0000 mg | ORAL_CAPSULE | Freq: Every day | ORAL | 1 refills | Status: DC
Start: 2023-05-18 — End: 2023-12-22

## 2023-05-18 NOTE — Patient Instructions (Signed)
We did not make any medication changes today.  Keep up the good work with maintaining no more than 1 caffeinated beverage per day and continue to think on cutting out that last cigarette daily.

## 2023-05-18 NOTE — Progress Notes (Signed)
BH MD Outpatient Progress Note  05/18/2023 8:43 AM Meagan Reynolds  MRN:  [id number]  Assessment:  Meagan Reynolds presents for follow-up evaluation. Today, 05/18/23, patient reports ongoing cessation of her depression after the titration of Cymbalta with only return of symptoms in the setting of running out of medication after being lost to follow-up.  Thankfully no further suicidal ideation even with running out of her Cymbalta.  When not on the Cymbalta the family can tell a pretty significant difference where she will become anxious in conversation and can have quick crying spells.    Physically is tolerating the Cymbalta well with no side effects.  Caffeine still 1 soda per day.  She smokes 1 cigarette/day and is precontemplative with regard to change.  With her social anxiety is still hesitant to engage in psychotherapy because it would entail having to open up to a new person.  Follow-up in 4 months due to clinical improvement.   For safety, her acute risk factors for suicide are: Current diagnosis of PTSD.  Her chronic risk factors for suicide are: Prior death of child, past suicide attempts by overdose, chronic mental illness.  Her protective factors are: Beloved pets living in the home, minor children living in the home, supportive family, employment, actively seeking and engaging with mental health care, contracting for safety, lack of intent with suicidal ideation, no suicidal ideation present today, hope for the future.  While future events cannot be fully predicted, she does not currently meet IVC criteria and can be continued as an outpatient.  Identifying Information: Meagan Reynolds (pronounced [name]) is a 47 y.o. female with a history of PTSD from the death of her child after being delivered in June 20, 2002, major depressive disorder with 2 lifetime suicide attempts, generalized anxiety disorder with panic attacks, social anxiety disorder, hypothyroidism, vitamin D deficiency, iron deficiency anemia  who is an established patient with [facility] participating in follow-up via video conferencing. Initial evaluation of depression and anxiety on 11/13/22; please see that note for full case discussion.  She denied any trauma history but struggles in crowds and being around people.  Some paranoia that someone will try to break into her home though this has never happened before.  She had several medication trials but all were ineffective including fluoxetine, sertraline, buspirone, venlafaxine.  There was one medication that she was told she could become addicted to but cannot over the name of this medication and unclear if it was Topamax or not.  There was some worsening of her mood and the lead up to her menses.  But she reported her mood is generally poor for most of the month as well.  She was amenable to trial of Cymbalta as next SNRI agent.  There are several physical reasons for her poor mood as well as her vitamin D level was low but unclear supplementation after finding this and has an iron deficiency anemia but her B12 and folate had not been checked in some time.  Additionally her previous thyroid medication provider left the practice and she was still getting levels sorted out.   Her depression did worsen when she thought of delivering her son who died in 06-20-04 after being born and she does qualify for PTSD from this event.  While she had 2 suicide attempts, the first being after this death of her child, she was contracting for safety and had no intent when the thoughts did come up.      Plan:   # PTSD  Past medication trials: See med trials below Status of problem: Improving Interventions: -- Continue Cymbalta 60 mg once daily (S3/7/24, i4/8/24, i5/8/24) --Patient to call insurer to find CBT provider that is in network --Consider prazosin in the future   # Major depressive disorder, recurrent, in partial remission  2 lifetime suicide attempts by overdose Past medication  trials:  Status of problem: Improving Interventions: -- Cymbalta, CBT as above   # Generalized anxiety disorder with panic attacks  social anxiety disorder  Past medication trials:  Status of problem: Improving Interventions: -- Cymbalta, CBT as above   # Vitamin D deficiency with hair loss  iron deficiency anemia  hypothyroidism Past medication trials:  Status of problem: Chronic and stable Interventions: -- Continue levothyroxine per PCP -- Awaiting results from PCP blood check recently   # Tobacco use disorder Past medication trials:  Status of problem: Chronic and stable Interventions: -- Tobacco cessation counseling provided  Patient was given contact information for behavioral health clinic and was instructed to call 911 for emergencies.   Subjective:  Chief Complaint:  Chief Complaint  Patient presents with   Post-Traumatic Stress Disorder   Anxiety   Follow-up    Interval History: Completely forgot about last appointment due to work schedule. Despite this, the medication has continued working and missing doses she can notice a big difference. Family comments quickly about her quick anger and/or gets anxious with people talking to her. Can have quick crying spells. Thankfully, SI didn't return and does think she is doing a lot better. Still caffeine one per day, one Coke zero. Still sleeping ok at night. Still smoking 1 cigarette per night.   Visit Diagnosis:    ICD-10-CM   1. Social anxiety disorder  F40.10 DULoxetine (CYMBALTA) 60 MG capsule    2. PTSD (post-traumatic stress disorder)  F43.10 DULoxetine (CYMBALTA) 60 MG capsule    3. Major depressive disorder, recurrent, in partial remission (HCC)  F33.41 DULoxetine (CYMBALTA) 60 MG capsule    4. Generalized anxiety disorder with panic attacks  F41.1 DULoxetine (CYMBALTA) 60 MG capsule   F41.0        Past Psychiatric History:  Diagnoses: PTSD from the death of her child after being delivered in 2005,  major depressive disorder with 2 lifetime suicide attempts, generalized anxiety disorder with panic attacks, social anxiety disorder Medication trials: fluoxetine (ineffective), zoloft (ineffective), sertraline (ineffective), buspirone (ineffective), venlafaxine (ineffective but never higher than 37.5mg ), topamax (ineffective) Previous psychiatrist/therapist: yes but insurance didn't cover so only saw once Hospitalizations: none Suicide attempts: overdosed as a teenager and overdosed when her baby died; not hospitalized for either because didn't seek help. SIB: none Hx of violence towards others: none Current access to guns: none Hx of abuse: none Substance use: none  Past Medical History:  Past Medical History:  Diagnosis Date   Agoraphobia with panic disorder 05/07/2016   Anemia    Anxiety    Caffeine overuse 11/13/2022   Depression    Diabetes mellitus without complication (HCC)    gestational   Gestational diabetes 05/07/2016   Hypertension    Thyroid disease     Past Surgical History:  Procedure Laterality Date   CHOLECYSTECTOMY  2004   TUBAL LIGATION      Family Psychiatric History: mother with depression, two sisters with anxiety and depression, grew up with parents saying never talk to anyone   Family History:  Family History  Problem Relation Age of Onset   Hyperlipidemia Mother    Hypertension Mother  Diabetes Mother    Heart disease Father 28       MI   Hyperthyroidism Sister    Diabetes Maternal Grandmother     Social History:  Social History   Socioeconomic History   Marital status: Married    Spouse name: Not on file   Number of children: Not on file   Years of education: Not on file   Highest education level: Not on file  Occupational History   Not on file  Tobacco Use   Smoking status: Every Day    Current packs/day: 0.00    Types: Cigarettes    Last attempt to quit: 10/24/2020    Years since quitting: 2.5   Smokeless tobacco: Never    Tobacco comments:    1 cigarettes daily   Vaping Use   Vaping status: Never Used  Substance and Sexual Activity   Alcohol use: No   Drug use: No   Sexual activity: Yes    Birth control/protection: Surgical  Other Topics Concern   Not on file  Social History Narrative   Married since 2003.Lives with husband and kids.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   Social Determinants of Health   Financial Resource Strain: Not on file  Food Insecurity: Not on file  Transportation Needs: Not on file  Physical Activity: Not on file  Stress: Not on file  Social Connections: Not on file    Allergies: No Known Allergies  Current Medications: Current Outpatient Medications  Medication Sig Dispense Refill   Semaglutide,0.25 or 0.5MG /DOS, (OZEMPIC, 0.25 OR 0.5 MG/DOSE,) 2 MG/3ML SOPN      atorvastatin (LIPITOR) 10 MG tablet Take 10 mg by mouth daily.     Azelaic Acid 15 % gel Apply topically 2 (two) times daily.     bisoprolol-hydrochlorothiazide (ZIAC) 5-6.25 MG tablet Take 1 tablet by mouth daily.     DULoxetine (CYMBALTA) 60 MG capsule Take 1 capsule (60 mg total) by mouth daily. 90 capsule 1   ferrous sulfate 324 MG TBEC Take 324 mg by mouth in the morning and at bedtime.     levothyroxine (SYNTHROID) 100 MCG tablet Take 100 mcg by mouth daily before breakfast.     metFORMIN (GLUCOPHAGE-XR) 500 MG 24 hr tablet Take 500 mg by mouth daily with breakfast.     No current facility-administered medications for this visit.    ROS: Review of Systems  Endocrine: Positive for cold intolerance.  Skin:        Hair loss  Neurological:  Positive for headaches.  Psychiatric/Behavioral:  Negative for decreased concentration, dysphoric mood, hallucinations, sleep disturbance and suicidal ideas. The patient is not nervous/anxious and is not hyperactive.     Objective:  Psychiatric Specialty Exam: There were no vitals taken for this  visit.There is no height or weight on file to calculate BMI.  General Appearance: Casual, Fairly Groomed, and appears stated age  Eye Contact:  Good  Speech:  Clear and Coherent and Normal Rate  Volume:  Normal  Mood:   "I can tell that medicine really works"  Affect:  Appropriate, Congruent, Constricted, and brighter with spontaneous smile  Thought Content: Logical and Hallucinations: None   Suicidal Thoughts:   No  Homicidal Thoughts:  No  Thought Process:  Goal Directed and Linear  Orientation:  Full (Time, Place, and Person)    Memory:  Immediate;   Fair  Judgment:  Fair  Insight:  Fair  Concentration:  Concentration:  Good  Recall:  Good  Fund of Knowledge: Fair  Language: Fair  Psychomotor Activity:  Normal  Akathisia:  No  AIMS (if indicated): not done  Assets:  Communication Skills Desire for Improvement Financial Resources/Insurance Housing Intimacy Leisure Time Physical Health Resilience Social Support Talents/Skills Transportation Vocational/Educational  ADL's:  Intact  Cognition: WNL  Sleep:  Good   PE: General: sits comfortably in view of camera; no acute distress  Pulm: no increased work of breathing on room air  MSK: all extremity movements appear intact  Neuro: no focal neurological deficits observed  Gait & Station: unable to assess by video    Metabolic Disorder Labs: Lab Results  Component Value Date   HGBA1C 5.6 01/21/2021   MPG 114 01/21/2021   MPG 103 05/07/2016   No results found for: "PROLACTIN" Lab Results  Component Value Date   CHOL 176 01/21/2021   TRIG 144 01/21/2021   HDL 45 (L) 01/21/2021   CHOLHDL 3.9 01/21/2021   LDLCALC 105 (H) 01/21/2021   Lab Results  Component Value Date   TSH 1.37 01/21/2021   TSH 12.219 (H) 11/23/2020    Therapeutic Level Labs: No results found for: "LITHIUM" No results found for: "VALPROATE" No results found for: "CBMZ"  Screenings:  PHQ2-9    Flowsheet Row Office Visit from 11/13/2022 in  [facility] [place] Office Visit from 01/21/2021 in [facility]  PHQ-2 Total Score 6 0  PHQ-9 Total Score 14 --      Flowsheet Row Office Visit from 11/13/2022 in [facility] [place] ED from 10/21/2020 in [facility]  C-SSRS RISK CATEGORY Moderate Risk No Risk       Collaboration of Care: Collaboration of Care: Medication Management AEB as above, Primary Care Provider AEB as above, and Referral or follow-up with counselor/therapist AEB as above  Patient/Guardian was advised Release of Information must be obtained prior to any record release in order to collaborate their care with an outside provider. Patient/Guardian was advised if they have not already done so to contact the registration department to sign all necessary forms in order for [place] to release information regarding their care.   Consent: Patient/Guardian gives verbal consent for treatment and assignment of benefits for services provided during this visit. Patient/Guardian expressed understanding and agreed to proceed.   Televisit via video: I connected with patient on 05/18/23 at  8:30 AM EDT by a video enabled telemedicine application and verified that I am speaking with the correct person using two identifiers.  Location: Patient: [place] at home Provider: remote office in [place]   I discussed the limitations of evaluation and management by telemedicine and the availability of in person appointments. The patient expressed understanding and agreed to proceed.  I discussed the assessment and treatment plan with the patient. The patient was provided an opportunity to ask questions and all were answered. The patient agreed with the plan and demonstrated an understanding of the instructions.   The patient was advised to call back or seek an in-person evaluation if the symptoms worsen or if the condition fails to  improve as anticipated.  I provided 15 minutes of virtual face-to-face time during this encounter.  Elsie Lincoln, MD 05/18/2023, 8:43 AM

## 2023-07-13 ENCOUNTER — Encounter (INDEPENDENT_AMBULATORY_CARE_PROVIDER_SITE_OTHER): Payer: Self-pay | Admitting: *Deleted

## 2023-09-28 ENCOUNTER — Telehealth (HOSPITAL_COMMUNITY): Payer: 59 | Admitting: Psychiatry

## 2023-12-13 ENCOUNTER — Other Ambulatory Visit (HOSPITAL_COMMUNITY): Payer: Self-pay | Admitting: Psychiatry

## 2023-12-13 DIAGNOSIS — F4310 Post-traumatic stress disorder, unspecified: Secondary | ICD-10-CM

## 2023-12-13 DIAGNOSIS — F4010 Social phobia, unspecified: Secondary | ICD-10-CM

## 2023-12-13 DIAGNOSIS — F3341 Major depressive disorder, recurrent, in partial remission: Secondary | ICD-10-CM

## 2023-12-13 DIAGNOSIS — F410 Panic disorder [episodic paroxysmal anxiety] without agoraphobia: Secondary | ICD-10-CM

## 2023-12-22 ENCOUNTER — Encounter (HOSPITAL_COMMUNITY): Payer: Self-pay | Admitting: Psychiatry

## 2023-12-22 ENCOUNTER — Telehealth (HOSPITAL_COMMUNITY): Admitting: Psychiatry

## 2023-12-22 DIAGNOSIS — F411 Generalized anxiety disorder: Secondary | ICD-10-CM | POA: Diagnosis not present

## 2023-12-22 DIAGNOSIS — F4010 Social phobia, unspecified: Secondary | ICD-10-CM

## 2023-12-22 DIAGNOSIS — F410 Panic disorder [episodic paroxysmal anxiety] without agoraphobia: Secondary | ICD-10-CM

## 2023-12-22 DIAGNOSIS — F4310 Post-traumatic stress disorder, unspecified: Secondary | ICD-10-CM

## 2023-12-22 DIAGNOSIS — F17200 Nicotine dependence, unspecified, uncomplicated: Secondary | ICD-10-CM | POA: Insufficient documentation

## 2023-12-22 DIAGNOSIS — E559 Vitamin D deficiency, unspecified: Secondary | ICD-10-CM

## 2023-12-22 DIAGNOSIS — D509 Iron deficiency anemia, unspecified: Secondary | ICD-10-CM

## 2023-12-22 DIAGNOSIS — F17210 Nicotine dependence, cigarettes, uncomplicated: Secondary | ICD-10-CM

## 2023-12-22 DIAGNOSIS — F3341 Major depressive disorder, recurrent, in partial remission: Secondary | ICD-10-CM

## 2023-12-22 MED ORDER — DULOXETINE HCL 30 MG PO CPEP
30.0000 mg | ORAL_CAPSULE | Freq: Every day | ORAL | 1 refills | Status: DC
Start: 2023-12-22 — End: 2024-01-19

## 2023-12-22 MED ORDER — DULOXETINE HCL 60 MG PO CPEP: 60.0000 mg | ORAL_CAPSULE | Freq: Every day | ORAL | 1 refills | Status: DC

## 2023-12-22 NOTE — Patient Instructions (Signed)
 We increase the duloxetine (Cymbalta) to 90 mg once daily.  You will get this dose by combining a 60 mg capsule with a 30 mg capsule once daily.  For the sensory overload, try getting the silicone earplugs from the local pharmacy and you can change their shape to allow more less sound down your ear canal.  You may also want to talk with your primary care provider about switching the primidone to a medicine called propranolol which could be taken as needed or daily to assist with tremor.

## 2023-12-22 NOTE — Progress Notes (Signed)
 BH MD Outpatient Progress Note  12/22/2023 1:59 PM [name]  MRN:  [id number]  Assessment:  Meagan Reynolds presents for follow-up evaluation. Today, 12/22/23, patient reports slight worsening of her depression and the 28-month hiatus from this clinic as well as worsening anxiety.  Given prior titration of Cymbalta and significant improvement to both of these will try further titration today.  She was recently started on primidone to address the tremor but cannot tell much of a difference yet and encouraged her to speak with her PCP about switching to propranolol.  Physically is tolerating the Cymbalta well with no side effects and the tremor preceded Cymbalta start.  Caffeine still 1 soda per day.  She smokes 1 cigarette/day and is precontemplative with regard to change.  With her social anxiety is still hesitant to engage in psychotherapy because it would entail having to open up to a new person.  Follow-up in 1 month.   For safety, her acute risk factors for suicide are: Current diagnosis of PTSD.  Her chronic risk factors for suicide are: Prior death of child, past suicide attempts by overdose, chronic mental illness.  Her protective factors are: Beloved pets living in the home, minor children living in the home, supportive family, employment, actively seeking and engaging with mental health care, contracting for safety, lack of intent with suicidal ideation, no suicidal ideation present today, hope for the future.  While future events cannot be fully predicted, she does not currently meet IVC criteria and can be continued as an outpatient.  Identifying Information: Meagan Reynolds (pronounced [name]) is a 48 y.o. female with a history of PTSD from the death of her child after being delivered in 2005, major depressive disorder with 2 lifetime suicide attempts, generalized anxiety disorder with panic attacks, social anxiety disorder, hypothyroidism, vitamin D deficiency, iron deficiency anemia who is an  established patient with [facility] participating in follow-up via video conferencing. Initial evaluation of depression and anxiety on 11/13/22; please see that note for full case discussion.  Lost to follow-up after September 2024 appointment.   Plan:   # PTSD Past medication trials: See med trials below Status of problem: Chronic with mild exacerbation Interventions: -- Continue Cymbalta 60 mg once daily (S3/7/24, i4/8/24, i5/8/24) --Patient to call insurer to find CBT provider that is in network --Consider prazosin in the future   # Major depressive disorder, recurrent, in partial remission  2 lifetime suicide attempts by overdose Past medication trials:  Status of problem: Chronic with mild exacerbation Interventions: -- Cymbalta, CBT as above   # Generalized anxiety disorder with panic attacks  social anxiety disorder  Past medication trials:  Status of problem: Chronic with mild exacerbation Interventions: -- Cymbalta, CBT as above   # Vitamin D deficiency with hair loss  iron deficiency anemia  hypothyroidism Past medication trials:  Status of problem: Chronic and stable Interventions: -- Continue levothyroxine per PCP -- Awaiting results from PCP blood check recently   # Tobacco use disorder Past medication trials:  Status of problem: Chronic and stable Interventions: -- Tobacco cessation counseling provided  Patient was given contact information for behavioral health clinic and was instructed to call 911 for emergencies.   Subjective:  Chief Complaint:  Chief Complaint  Patient presents with   Anxiety   Depression   Follow-up   Stress    Interval History: Mostly just working since last appointment 7 months ago. Mood has remained good but can still notice anxiety/depression. Would be amenable to  titration of her dose today. Irritation still primarily for sensory overload with too much loud noise and kids all asking questions at the  same time. Can start screaming in these instances. Has had a left hand tremor for some time and PCP started primidone but unclear if it is working. Doesn't think she has tried propranolol but will ask PCP about switching. Has continued using weight on ozempic and metformin. Down to 180 from over 200. Still no SI. Still caffeine one per day, one Coke zero but no longer everyday. Still sleeping good at night. Still smoking 1 cigarette per night.   Visit Diagnosis:    ICD-10-CM   1. Generalized anxiety disorder with panic attacks  F41.1 DULoxetine (CYMBALTA) 60 MG capsule   F41.0 DULoxetine (CYMBALTA) 30 MG capsule    2. Social anxiety disorder  F40.10 DULoxetine (CYMBALTA) 60 MG capsule    DULoxetine (CYMBALTA) 30 MG capsule    3. PTSD (post-traumatic stress disorder)  F43.10 DULoxetine (CYMBALTA) 60 MG capsule    DULoxetine (CYMBALTA) 30 MG capsule    4. Major depressive disorder, recurrent, in partial remission (HCC)  F33.41 DULoxetine (CYMBALTA) 60 MG capsule    DULoxetine (CYMBALTA) 30 MG capsule    5. Iron deficiency anemia, unspecified iron deficiency anemia type  D50.9     6. Vitamin D deficiency  E55.9     7. Tobacco use disorder  F17.200         Past Psychiatric History:  Diagnoses: PTSD from the death of her child after being delivered in 2005, major depressive disorder with 2 lifetime suicide attempts, generalized anxiety disorder with panic attacks, social anxiety disorder Medication trials: fluoxetine (ineffective), zoloft (ineffective), sertraline (ineffective), buspirone (ineffective), venlafaxine (ineffective but never higher than 37.5mg ), topamax (ineffective) Previous psychiatrist/therapist: yes but insurance didn't cover so only saw once Hospitalizations: none Suicide attempts: overdosed as a teenager and overdosed when her baby died; not hospitalized for either because didn't seek help. SIB: none Hx of violence towards others: none Current access to guns: none Hx  of abuse: none Substance use: none  Past Medical History:  Past Medical History:  Diagnosis Date   Agoraphobia with panic disorder 05/07/2016   Anemia    Anxiety    Caffeine overuse 11/13/2022   Depression    Diabetes mellitus without complication (HCC)    gestational   Gestational diabetes 05/07/2016   Hypertension    Thyroid disease     Past Surgical History:  Procedure Laterality Date   CHOLECYSTECTOMY  2004   TUBAL LIGATION      Family Psychiatric History: mother with depression, two sisters with anxiety and depression, grew up with parents saying never talk to anyone   Family History:  Family History  Problem Relation Age of Onset   Hyperlipidemia Mother    Hypertension Mother    Diabetes Mother    Heart disease Father 66       MI   Hyperthyroidism Sister    Diabetes Maternal Grandmother     Social History:  Social History   Socioeconomic History   Marital status: Married    Spouse name: Not on file   Number of children: Not on file   Years of education: Not on file   Highest education level: Not on file  Occupational History   Not on file  Tobacco Use   Smoking status: Every Day    Current packs/day: 0.00    Types: Cigarettes    Last attempt to quit: 10/24/2020  Years since quitting: 3.1   Smokeless tobacco: Never   Tobacco comments:    1 cigarettes daily   Vaping Use   Vaping status: Never Used  Substance and Sexual Activity   Alcohol use: No   Drug use: No   Sexual activity: Yes    Birth control/protection: Surgical  Other Topics Concern   Not on file  Social History Narrative   Married since 2003.Lives with husband and kids.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   Social Drivers of Corporate investment banker Strain: Not on file  Food Insecurity: Not on file  Transportation Needs: Not on file  Physical Activity: Not on file  Stress: Not on file  Social Connections: Not on file     Allergies: No Known Allergies  Current Medications: Current Outpatient Medications  Medication Sig Dispense Refill   DULoxetine (CYMBALTA) 30 MG capsule Take 1 capsule (30 mg total) by mouth daily. Take with 60 mg dose once daily. 90 capsule 1   primidone (MYSOLINE) 50 MG tablet Take 25 mg by mouth daily.     atorvastatin (LIPITOR) 10 MG tablet Take 10 mg by mouth daily.     Azelaic Acid 15 % gel Apply topically 2 (two) times daily.     bisoprolol-hydrochlorothiazide (ZIAC) 5-6.25 MG tablet Take 1 tablet by mouth daily.     DULoxetine (CYMBALTA) 60 MG capsule Take 1 capsule (60 mg total) by mouth daily. Take with 30 mg dose once daily. 90 capsule 1   ferrous sulfate 324 MG TBEC Take 324 mg by mouth in the morning and at bedtime.     levothyroxine (SYNTHROID) 100 MCG tablet Take 100 mcg by mouth daily before breakfast.     metFORMIN (GLUCOPHAGE-XR) 500 MG 24 hr tablet Take 500 mg by mouth daily with breakfast.     Semaglutide,0.25 or 0.5MG /DOS, (OZEMPIC, 0.25 OR 0.5 MG/DOSE,) 2 MG/3ML SOPN      No current facility-administered medications for this visit.    ROS: Review of Systems  Endocrine: Positive for cold intolerance.  Skin:        Hair loss  Neurological:  Positive for tremors and headaches.  Psychiatric/Behavioral:  Negative for decreased concentration, dysphoric mood, hallucinations, sleep disturbance and suicidal ideas. The patient is nervous/anxious. The patient is not hyperactive.     Objective:  Psychiatric Specialty Exam: There were no vitals taken for this visit.There is no height or weight on file to calculate BMI.  General Appearance: Casual, Fairly Groomed, and appears stated age  Eye Contact:  Good  Speech:  Clear and Coherent and Normal Rate  Volume:  Normal  Mood:   "Good but I can notice the anxiety and depression"  Affect:  Appropriate, Congruent, Constricted, and anxious and slightly depressed with spontaneous smile  Thought Content: Logical and  Hallucinations: None   Suicidal Thoughts:   No  Homicidal Thoughts:  No  Thought Process:  Goal Directed and Linear  Orientation:  Full (Time, Place, and Person)    Memory:  Immediate;   Fair  Judgment:  Fair  Insight:  Fair  Concentration:  Concentration: Good  Recall:  [place] Major of Knowledge: Fair  Language: Fair  Psychomotor Activity:  Normal  Akathisia:  No  AIMS (if indicated): not done  Assets:  Communication Skills Desire for Improvement Financial Resources/Insurance Housing Intimacy Leisure Time Physical Health Resilience Social Support Talents/Skills Transportation Vocational/Educational  ADL's:  Intact  Cognition: WNL  Sleep:  Good   PE: General: sits comfortably in view of camera; no acute distress  Pulm: no increased work of breathing on room air  MSK: all extremity movements appear intact  Neuro: no focal neurological deficits observed, no tremor observed today in left hand Gait & Station: unable to assess by video    Metabolic Disorder Labs: Lab Results  Component Value Date   HGBA1C 5.6 01/21/2021   MPG 114 01/21/2021   MPG 103 05/07/2016   No results found for: "PROLACTIN" Lab Results  Component Value Date   CHOL 176 01/21/2021   TRIG 144 01/21/2021   HDL 45 (L) 01/21/2021   CHOLHDL 3.9 01/21/2021   LDLCALC 105 (H) 01/21/2021   Lab Results  Component Value Date   TSH 1.37 01/21/2021   TSH 12.219 (H) 11/23/2020    Therapeutic Level Labs: No results found for: "LITHIUM" No results found for: "VALPROATE" No results found for: "CBMZ"  Screenings:  PHQ2-9    Flowsheet Row Office Visit from 11/13/2022 in [facility] [place] Office Visit from 01/21/2021 in [facility]  PHQ-2 Total Score 6 0  PHQ-9 Total Score 14 --      Flowsheet Row Office Visit from 11/13/2022 in [facility] [place] ED from 10/21/2020 in [facility]  C-SSRS RISK CATEGORY Moderate Risk No Risk       Collaboration of Care: Collaboration of Care: Medication Management AEB as above, Primary Care Provider AEB as above, and Referral or follow-up with counselor/therapist AEB as above  Patient/Guardian was advised Release of Information must be obtained prior to any record release in order to collaborate their care with an outside provider. Patient/Guardian was advised if they have not already done so to contact the registration department to sign all necessary forms in order for us  to release information regarding their care.   Consent: Patient/Guardian gives verbal consent for treatment and assignment of benefits for services provided during this visit. Patient/Guardian expressed understanding and agreed to proceed.   Televisit via video: I connected with patient on 12/22/23 at  1:30 PM EDT by a video enabled telemedicine application and verified that I am speaking with the correct person using two identifiers.  Location: Patient: [place] at home Provider: remote office in [place]   I discussed the limitations of evaluation and management by telemedicine and the availability of in person appointments. The patient expressed understanding and agreed to proceed.  I discussed the assessment and treatment plan with the patient. The patient was provided an opportunity to ask questions and all were answered. The patient agreed with the plan and demonstrated an understanding of the instructions.   The patient was advised to call back or seek an in-person evaluation if the symptoms worsen or if the condition fails to improve as anticipated.  I provided 20 minutes of virtual face-to-face time during this encounter.  Madie Schilling, MD 12/22/2023, 1:59 PM

## 2024-01-19 ENCOUNTER — Encounter (HOSPITAL_COMMUNITY): Payer: Self-pay | Admitting: Psychiatry

## 2024-01-19 ENCOUNTER — Telehealth (INDEPENDENT_AMBULATORY_CARE_PROVIDER_SITE_OTHER): Admitting: Psychiatry

## 2024-01-19 DIAGNOSIS — F17210 Nicotine dependence, cigarettes, uncomplicated: Secondary | ICD-10-CM

## 2024-01-19 DIAGNOSIS — F17200 Nicotine dependence, unspecified, uncomplicated: Secondary | ICD-10-CM

## 2024-01-19 DIAGNOSIS — F411 Generalized anxiety disorder: Secondary | ICD-10-CM

## 2024-01-19 DIAGNOSIS — F3341 Major depressive disorder, recurrent, in partial remission: Secondary | ICD-10-CM

## 2024-01-19 DIAGNOSIS — F4310 Post-traumatic stress disorder, unspecified: Secondary | ICD-10-CM

## 2024-01-19 DIAGNOSIS — F410 Panic disorder [episodic paroxysmal anxiety] without agoraphobia: Secondary | ICD-10-CM

## 2024-01-19 DIAGNOSIS — R251 Tremor, unspecified: Secondary | ICD-10-CM

## 2024-01-19 DIAGNOSIS — F4001 Agoraphobia with panic disorder: Secondary | ICD-10-CM | POA: Diagnosis not present

## 2024-01-19 DIAGNOSIS — F4010 Social phobia, unspecified: Secondary | ICD-10-CM

## 2024-01-19 MED ORDER — DULOXETINE HCL 60 MG PO CPEP: 60.0000 mg | ORAL_CAPSULE | Freq: Every day | ORAL | 1 refills | Status: AC

## 2024-01-19 NOTE — Progress Notes (Signed)
 BH MD Outpatient Progress Note  01/19/2024 5:05 PM Meagan Reynolds  MRN:  [id number]  Assessment:  Meagan Reynolds presents for follow-up evaluation. Today, 01/19/24, patient reports no significant improvement to depression or anxiety with titration of Cymbalta  though objectively anxiety was the predominant symptom in session today.  This was responsive to mindfulness techniques but we will decrease Cymbalta  as it may be causing an eye tremor. She was started on primidone to address the and tremor but cannot tell much of a difference yet and encouraged her to speak with her PCP about switching to propranolol.  The tremor preceded Cymbalta  start.  Caffeine still 1 soda per day.  She smokes 1 cigarette/day and is precontemplative with regard to change.  With her social anxiety is still hesitant to engage in psychotherapy because it would entail having to open up to a new person.  No follow-up planned with provider transition.   For safety, her acute risk factors for suicide are: Current diagnosis of PTSD.  Her chronic risk factors for suicide are: Prior death of child, past suicide attempts by overdose, chronic mental illness.  Her protective factors are: Beloved pets living in the home, minor children living in the home, supportive family, employment, actively seeking and engaging with mental health care, contracting for safety, lack of intent with suicidal ideation, no suicidal ideation present today, hope for the future.  While future events cannot be fully predicted, she does not currently meet IVC criteria and can be continued as an outpatient.  Identifying Information: Meagan Reynolds (pronounced [name]) is a 48 y.o. female with a history of PTSD from the death of her child after being delivered in 2005, major depressive disorder with 2 lifetime suicide attempts, generalized anxiety disorder with panic attacks, social anxiety disorder, hypothyroidism, vitamin D deficiency, iron deficiency anemia who is an  established patient with [facility] participating in follow-up via video conferencing. Initial evaluation of depression and anxiety on 11/13/22; please see that note for full case discussion.  Lost to follow-up after September 2024 appointment.   Plan:   # PTSD Past medication trials: See med trials below Status of problem: Improving Interventions: -- Decrease Cymbalta  to 60 mg once daily (S3/7/24, i4/8/24, i5/8/24, d5/13/25) --Patient to call insurer to find CBT provider that is in network --Consider prazosin in the future   # Major depressive disorder, recurrent, in partial remission  2 lifetime suicide attempts by overdose Past medication trials:  Status of problem: Chronic and stable Interventions: -- Cymbalta , CBT as above   # Generalized anxiety disorder with panic attacks  social anxiety disorder  Past medication trials:  Status of problem: Chronic with mild exacerbation Interventions: -- Cymbalta , CBT as above  # Left hand tremor with new eye twitching Past medication trials:  Status of problem: Chronic with mild exacerbation Interventions: -- Decrease Cymbalta  as above --Patient discussed with PCP switch to propranolol from current antihypertensive   # Vitamin D deficiency with hair loss  iron deficiency anemia  hypothyroidism Past medication trials:  Status of problem: Chronic and stable Interventions: -- Continue levothyroxine  per PCP -- Awaiting results from PCP blood check recently   # Tobacco use disorder Past medication trials:  Status of problem: Chronic and stable Interventions: -- Tobacco cessation counseling provided  Patient was given contact information for behavioral health clinic and was instructed to call 911 for emergencies.   Subjective:  Chief Complaint:  Chief Complaint  Patient presents with   Anxiety   Follow-up   Stress  Panic Attack   Tremors    Interval History: Things have been good but maybe not so  good since last appointment. Hoping for prescription change as she is noticing tremors of her eye. Only started once cymbalta  titrated to 90mg  daily. Does still feel like others are staring at her when going out into public and wanting to leave as quickly as possible.  Practice 5 senses technique and vagal tone exercises with improvement to anxious affect.  Does still have left hand tremor that has been stable. Diabetes is well controlled with no low blood sugars recently. Has continued losing weight on ozempic and metformin. Down to 180 from over 200. Still no SI. Still caffeine one per day, one Coke zero but no longer everyday. Still sleeping good at night. Still smoking 1 cigarette per night.   Visit Diagnosis:    ICD-10-CM   1. Tobacco use disorder  F17.200     2. Social anxiety disorder  F40.10 DULoxetine  (CYMBALTA ) 60 MG capsule    3. PTSD (post-traumatic stress disorder)  F43.10 DULoxetine  (CYMBALTA ) 60 MG capsule    4. Major depressive disorder, recurrent, in partial remission (HCC)  F33.41 DULoxetine  (CYMBALTA ) 60 MG capsule    5. Generalized anxiety disorder with panic attacks  F41.1 DULoxetine  (CYMBALTA ) 60 MG capsule   F41.0     6. Tremor  R25.1          Past Psychiatric History:  Diagnoses: PTSD from the death of her child after being delivered in 2005, major depressive disorder with 2 lifetime suicide attempts, generalized anxiety disorder with panic attacks, social anxiety disorder Medication trials: fluoxetine  (ineffective), zoloft (ineffective), sertraline (ineffective), buspirone (ineffective), venlafaxine (ineffective but never higher than 37.5mg ), topamax (ineffective) Previous psychiatrist/therapist: yes but insurance didn't cover so only saw once Hospitalizations: none Suicide attempts: overdosed as a teenager and overdosed when her baby died; not hospitalized for either because didn't seek help. SIB: none Hx of violence towards others: none Current access to guns:  none Hx of abuse: none Substance use: none  Past Medical History:  Past Medical History:  Diagnosis Date   Agoraphobia with panic disorder 05/07/2016   Anemia    Anxiety    Caffeine overuse 11/13/2022   Depression    Diabetes mellitus without complication (HCC)    gestational   Gestational diabetes 05/07/2016   Hypertension    Thyroid  disease     Past Surgical History:  Procedure Laterality Date   CHOLECYSTECTOMY  2004   TUBAL LIGATION      Family Psychiatric History: mother with depression, two sisters with anxiety and depression, grew up with parents saying never talk to anyone   Family History:  Family History  Problem Relation Age of Onset   Hyperlipidemia Mother    Hypertension Mother    Diabetes Mother    Heart disease Father 46       MI   Hyperthyroidism Sister    Diabetes Maternal Grandmother     Social History:  Social History   Socioeconomic History   Marital status: Married    Spouse name: Not on file   Number of children: Not on file   Years of education: Not on file   Highest education level: Not on file  Occupational History   Not on file  Tobacco Use   Smoking status: Every Day    Current packs/day: 0.00    Types: Cigarettes    Last attempt to quit: 10/24/2020    Years since quitting: 3.2  Smokeless tobacco: Never   Tobacco comments:    1 cigarettes daily   Vaping Use   Vaping status: Never Used  Substance and Sexual Activity   Alcohol use: No   Drug use: No   Sexual activity: Yes    Birth control/protection: Surgical  Other Topics Concern   Not on file  Social History Narrative   Married since 2003.Lives with husband and kids.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   Social Drivers of Corporate investment banker Strain: Not on file  Food Insecurity: Not on file  Transportation Needs: Not on file  Physical Activity: Not on file  Stress: Not on file  Social Connections:  Not on file    Allergies: No Known Allergies  Current Medications: Current Outpatient Medications  Medication Sig Dispense Refill   atorvastatin (LIPITOR) 20 MG tablet Take 20 mg by mouth daily.     Azelaic Acid 15 % gel Apply topically 2 (two) times daily.     bisoprolol-hydrochlorothiazide (ZIAC) 5-6.25 MG tablet Take 1 tablet by mouth daily.     DULoxetine  (CYMBALTA ) 60 MG capsule Take 1 capsule (60 mg total) by mouth daily. 90 capsule 1   levothyroxine  (SYNTHROID ) 100 MCG tablet Take 100 mcg by mouth daily before breakfast.     metFORMIN (GLUCOPHAGE-XR) 500 MG 24 hr tablet Take 500 mg by mouth daily with breakfast.     primidone (MYSOLINE) 50 MG tablet Take 25 mg by mouth daily.     Semaglutide,0.25 or 0.5MG /DOS, (OZEMPIC, 0.25 OR 0.5 MG/DOSE,) 2 MG/3ML SOPN      No current facility-administered medications for this visit.    ROS: Review of Systems  Endocrine: Positive for cold intolerance.  Skin:        Hair loss  Neurological:  Positive for tremors and headaches.  Psychiatric/Behavioral:  Negative for decreased concentration, dysphoric mood, hallucinations, sleep disturbance and suicidal ideas. The patient is nervous/anxious. The patient is not hyperactive.     Objective:  Psychiatric Specialty Exam: There were no vitals taken for this visit.There is no height or weight on file to calculate BMI.  General Appearance: Casual, Fairly Groomed, and appears stated age  Eye Contact:  Good  Speech:  Clear and Coherent and Normal Rate  Volume:  Normal  Mood:  "Good but not so good"  Affect:  Appropriate, Congruent, Constricted, and anxious with spontaneous smile  Thought Content: Logical and Hallucinations: None   Suicidal Thoughts:  No  Homicidal Thoughts:  No  Thought Process:  Goal Directed and Linear  Orientation:  Full (Time, Place, and Person)    Memory:  Immediate;   Fair  Judgment:  Fair  Insight:  Fair  Concentration:  Concentration: Good  Recall:  Good  Fund  of Knowledge: Fair  Language: Fair  Psychomotor Activity:  Normal  Akathisia:  No  AIMS (if indicated): not done  Assets:  Communication Skills Desire for Improvement Financial Resources/Insurance Housing Intimacy Leisure Time Physical Health Resilience Social Support Talents/Skills Transportation Vocational/Educational  ADL's:  Intact  Cognition: WNL  Sleep:  Good   PE: General: sits comfortably in view of camera; no acute distress  Pulm: no increased work of breathing on room air  MSK: all extremity movements appear intact  Neuro: no focal neurological deficits observed, no tremor observed today in left hand Gait & Station: unable to assess by video    Metabolic Disorder Labs: Lab Results  Component Value Date   HGBA1C  5.6 01/21/2021   MPG 114 01/21/2021   MPG 103 05/07/2016   No results found for: "PROLACTIN" Lab Results  Component Value Date   CHOL 176 01/21/2021   TRIG 144 01/21/2021   HDL 45 (L) 01/21/2021   CHOLHDL 3.9 01/21/2021   LDLCALC 105 (H) 01/21/2021   Lab Results  Component Value Date   TSH 1.37 01/21/2021   TSH 12.219 (H) 11/23/2020    Therapeutic Level Labs: No results found for: "LITHIUM" No results found for: "VALPROATE" No results found for: "CBMZ"  Screenings:  PHQ2-9    Flowsheet Row Office Visit from 11/13/2022 in [facility] [place] Office Visit from 01/21/2021 in [facility]  PHQ-2 Total Score 6 0  PHQ-9 Total Score 14 --      Flowsheet Row Office Visit from 11/13/2022 in [facility] [place] [facility] from 10/21/2020 in [facility]  C-SSRS RISK CATEGORY Moderate Risk No Risk       Collaboration of Care: Collaboration of Care: Medication Management AEB as above, Primary Care Provider AEB as above, and Referral or follow-up with counselor/therapist AEB as above  Patient/Guardian was advised Release of  Information must be obtained prior to any record release in order to collaborate their care with an outside provider. Patient/Guardian was advised if they have not already done so to contact the registration department to sign all necessary forms in order for us  to release information regarding their care.   Consent: Patient/Guardian gives verbal consent for treatment and assignment of benefits for services provided during this visit. Patient/Guardian expressed understanding and agreed to proceed.   Televisit via video: I connected with patient on 01/19/24 at  4:30 PM EDT by a video enabled telemedicine application and verified that I am speaking with the correct person using two identifiers.  Location: Patient: [place] at home Provider: remote office in [place]   I discussed the limitations of evaluation and management by telemedicine and the availability of in person appointments. The patient expressed understanding and agreed to proceed.  I discussed the assessment and treatment plan with the patient. The patient was provided an opportunity to ask questions and all were answered. The patient agreed with the plan and demonstrated an understanding of the instructions.   The patient was advised to call back or seek an in-person evaluation if the symptoms worsen or if the condition fails to improve as anticipated.  I provided 30 minutes of virtual face-to-face time during this encounter.  Madie Schilling, MD 01/19/2024, 5:05 PM

## 2024-01-19 NOTE — Patient Instructions (Signed)
 We decreased her Cymbalta  (duloxetine ) to 60 mg once daily today.  Remember to keep practicing the mindfulness techniques we went over today (5 senses technique).  Please follow up with your primary care provider about a medication called propranolol as we cannot make it with the current blood pressure medication you are on.  If you are able to switch, you would take a 10 mg dose twice daily as needed for anxiety/panic and this could be taken daily to help with the tremor you have been experiencing.  This may be able to replace the primidone.

## 2024-03-18 ENCOUNTER — Other Ambulatory Visit (HOSPITAL_COMMUNITY): Payer: Self-pay | Admitting: Family Medicine

## 2024-03-18 DIAGNOSIS — Z8639 Personal history of other endocrine, nutritional and metabolic disease: Secondary | ICD-10-CM

## 2024-03-22 ENCOUNTER — Other Ambulatory Visit: Payer: Self-pay

## 2024-03-25 ENCOUNTER — Ambulatory Visit (HOSPITAL_COMMUNITY)

## 2024-03-28 ENCOUNTER — Telehealth: Payer: Self-pay

## 2024-03-28 NOTE — Telephone Encounter (Signed)
 Auth Submission: NO AUTH NEEDED Site of care: Site of care: [facility] Payer: [employer] medicaid Medication & CPT/J Code(s) submitted: Feraheme (ferumoxytol) [id number] Diagnosis Code:  Route of submission (phone, fax, portal): phone Phone # Fax # Auth type: Buy/Bill HB Units/visits requested: 510mg  x 2doses Reference number:  Approval from: 03/28/24 to 09/07/24

## 2024-03-29 ENCOUNTER — Ambulatory Visit (HOSPITAL_COMMUNITY)
Admission: RE | Admit: 2024-03-29 | Discharge: 2024-03-29 | Disposition: A | Discharge status: Home / Self Care | Source: Ambulatory Visit | Attending: Family Medicine | Admitting: Family Medicine

## 2024-03-29 DIAGNOSIS — Z8639 Personal history of other endocrine, nutritional and metabolic disease: Secondary | ICD-10-CM | POA: Diagnosis present

## 2024-04-01 ENCOUNTER — Ambulatory Visit

## 2024-04-01 MED ORDER — ACETAMINOPHEN 325 MG PO TABS
650.0000 mg | ORAL_TABLET | Freq: Once | ORAL | Status: AC
  Administered 2024-04-15: 650 mg via ORAL

## 2024-04-01 MED ORDER — DIPHENHYDRAMINE HCL 25 MG PO CAPS
25.0000 mg | ORAL_CAPSULE | Freq: Once | ORAL | Status: AC
  Administered 2024-04-15: 25 mg via ORAL

## 2024-04-08 ENCOUNTER — Ambulatory Visit

## 2024-04-15 ENCOUNTER — Encounter: Attending: Family Medicine | Admitting: Internal Medicine

## 2024-04-15 VITALS — BP 96/62 | HR 77 | Temp 97.3°F | Resp 16

## 2024-04-15 DIAGNOSIS — D509 Iron deficiency anemia, unspecified: Secondary | ICD-10-CM | POA: Insufficient documentation

## 2024-04-15 MED ORDER — SODIUM CHLORIDE 0.9 % IV SOLN
510.0000 mg | Freq: Once | INTRAVENOUS | Status: AC
Start: 2024-04-15 — End: 2024-04-15
  Administered 2024-04-15: 510 mg via INTRAVENOUS
  Filled 2024-04-15: qty 17

## 2024-04-15 NOTE — Progress Notes (Signed)
 Diagnosis: Iron Deficiency Anemia  Provider:  Huffman,Derek, NP  Procedure: IV Infusion  IV Type: Peripheral, IV Location: L Antecubital  Feraheme  (Ferumoxytol ), Dose: 510 mg  Infusion Start Time: 1134  Infusion Stop Time: 1154  Post Infusion IV Care: Observation period completed  Discharge: Condition: Good, Destination: Home . AVS Provided  Performed by:  Blanca Selinda SAUNDERS, LPN

## 2024-04-22 ENCOUNTER — Ambulatory Visit

## 2024-04-28 ENCOUNTER — Ambulatory Visit

## 2024-05-02 ENCOUNTER — Encounter: Admitting: *Deleted

## 2024-05-02 VITALS — BP 110/70 | HR 82 | Temp 98.1°F | Resp 16

## 2024-05-02 DIAGNOSIS — D509 Iron deficiency anemia, unspecified: Secondary | ICD-10-CM

## 2024-05-02 MED ORDER — DIPHENHYDRAMINE HCL 25 MG PO CAPS
25.0000 mg | ORAL_CAPSULE | Freq: Once | ORAL | Status: AC
  Administered 2024-05-02: 25 mg via ORAL

## 2024-05-02 MED ORDER — ACETAMINOPHEN 325 MG PO TABS
650.0000 mg | ORAL_TABLET | Freq: Once | ORAL | Status: AC
  Administered 2024-05-02: 650 mg via ORAL

## 2024-05-02 MED ORDER — SODIUM CHLORIDE 0.9 % IV SOLN
510.0000 mg | Freq: Once | INTRAVENOUS | Status: AC
  Administered 2024-05-02: 510 mg via INTRAVENOUS
  Filled 2024-05-02: qty 17

## 2024-05-02 NOTE — Progress Notes (Signed)
 Diagnosis: Iron Deficiency Anemia  Provider:  Carliss Batty, NP  Procedure: IV Infusion  IV Type: Peripheral, IV Location: L Antecubital  Feraheme  (Ferumoxytol ), Dose: 510 mg  Infusion Start Time: 1147  Infusion Stop Time: 1202  Post Infusion IV Care: Observation period completed  Discharge: Condition: Good, Destination: Home . AVS Provided  Performed by:  Baldwin Darice Helling, RN

## 2024-08-17 ENCOUNTER — Other Ambulatory Visit (HOSPITAL_COMMUNITY): Payer: Self-pay

## 2024-08-17 DIAGNOSIS — M79671 Pain in right foot: Secondary | ICD-10-CM

## 2024-11-15 ENCOUNTER — Ambulatory Visit: Admitting: Neurology

## 2025-01-16 ENCOUNTER — Ambulatory Visit (HOSPITAL_COMMUNITY): Admitting: Registered Nurse
